# Patient Record
Sex: Female | Born: 1985 | Race: Black or African American | Hispanic: No | Marital: Single | Smoking: Current some day smoker
Health system: Southern US, Community
[De-identification: ages and names within clinical notes are randomized; demographics above are authoritative.]

## PROBLEM LIST (undated history)

## (undated) ENCOUNTER — Inpatient Hospital Stay (HOSPITAL_COMMUNITY): Payer: Self-pay

## (undated) DIAGNOSIS — Z87442 Personal history of urinary calculi: Secondary | ICD-10-CM

## (undated) DIAGNOSIS — K219 Gastro-esophageal reflux disease without esophagitis: Secondary | ICD-10-CM

## (undated) DIAGNOSIS — N200 Calculus of kidney: Secondary | ICD-10-CM

## (undated) DIAGNOSIS — E669 Obesity, unspecified: Secondary | ICD-10-CM

## (undated) HISTORY — DX: Gastro-esophageal reflux disease without esophagitis: K21.9

## (undated) HISTORY — DX: Calculus of kidney: N20.0

## (undated) HISTORY — DX: Obesity, unspecified: E66.9

---

## 2007-07-20 ENCOUNTER — Emergency Department (HOSPITAL_COMMUNITY): Admission: EM | Admit: 2007-07-20 | Discharge: 2007-07-20 | Payer: Self-pay | Admitting: Emergency Medicine

## 2007-07-23 ENCOUNTER — Emergency Department (HOSPITAL_COMMUNITY): Admission: EM | Admit: 2007-07-23 | Discharge: 2007-07-23 | Payer: Self-pay | Admitting: Emergency Medicine

## 2007-09-30 ENCOUNTER — Emergency Department (HOSPITAL_COMMUNITY): Admission: EM | Admit: 2007-09-30 | Discharge: 2007-09-30 | Payer: Self-pay | Admitting: Emergency Medicine

## 2008-03-24 ENCOUNTER — Emergency Department (HOSPITAL_COMMUNITY): Admission: EM | Admit: 2008-03-24 | Discharge: 2008-03-24 | Payer: Self-pay | Admitting: Emergency Medicine

## 2008-03-25 ENCOUNTER — Emergency Department (HOSPITAL_COMMUNITY): Admission: EM | Admit: 2008-03-25 | Discharge: 2008-03-25 | Payer: Self-pay | Admitting: Emergency Medicine

## 2008-08-04 ENCOUNTER — Emergency Department (HOSPITAL_COMMUNITY): Admission: EM | Admit: 2008-08-04 | Discharge: 2008-08-04 | Payer: Self-pay | Admitting: Emergency Medicine

## 2008-10-29 ENCOUNTER — Emergency Department (HOSPITAL_COMMUNITY): Admission: EM | Admit: 2008-10-29 | Discharge: 2008-10-29 | Payer: Self-pay | Admitting: Emergency Medicine

## 2009-05-19 ENCOUNTER — Emergency Department (HOSPITAL_COMMUNITY): Admission: EM | Admit: 2009-05-19 | Discharge: 2009-05-19 | Payer: Self-pay | Admitting: Emergency Medicine

## 2009-07-14 ENCOUNTER — Ambulatory Visit: Payer: Self-pay | Admitting: Family Medicine

## 2009-07-14 DIAGNOSIS — F17200 Nicotine dependence, unspecified, uncomplicated: Secondary | ICD-10-CM | POA: Insufficient documentation

## 2009-07-14 DIAGNOSIS — R5383 Other fatigue: Secondary | ICD-10-CM

## 2009-07-14 DIAGNOSIS — J45909 Unspecified asthma, uncomplicated: Secondary | ICD-10-CM

## 2009-07-14 DIAGNOSIS — E669 Obesity, unspecified: Secondary | ICD-10-CM

## 2009-07-14 DIAGNOSIS — G43109 Migraine with aura, not intractable, without status migrainosus: Secondary | ICD-10-CM | POA: Insufficient documentation

## 2009-07-14 DIAGNOSIS — R5381 Other malaise: Secondary | ICD-10-CM

## 2009-07-15 ENCOUNTER — Encounter: Payer: Self-pay | Admitting: Family Medicine

## 2009-07-15 LAB — CONVERTED CEMR LAB
BUN: 8 mg/dL (ref 6–23)
Basophils Absolute: 0 10*3/uL (ref 0.0–0.1)
Cholesterol: 127 mg/dL (ref 0–200)
Creatinine, Ser: 0.83 mg/dL (ref 0.40–1.20)
Eosinophils Absolute: 0.4 10*3/uL (ref 0.0–0.7)
Eosinophils Relative: 5 % (ref 0–5)
HCT: 44.4 % (ref 36.0–46.0)
Lymphocytes Relative: 49 % — ABNORMAL HIGH (ref 12–46)
Neutrophils Relative %: 34 % — ABNORMAL LOW (ref 43–77)
Platelets: 335 10*3/uL (ref 150–400)
Potassium: 4.6 meq/L (ref 3.5–5.3)
RDW: 14.7 % (ref 11.5–15.5)
Triglycerides: 94 mg/dL (ref <150)

## 2009-11-04 ENCOUNTER — Ambulatory Visit: Payer: Self-pay | Admitting: Family Medicine

## 2009-11-04 ENCOUNTER — Other Ambulatory Visit: Admission: RE | Admit: 2009-11-04 | Discharge: 2009-11-04 | Payer: Self-pay | Admitting: Family Medicine

## 2009-11-10 ENCOUNTER — Encounter: Payer: Self-pay | Admitting: Family Medicine

## 2010-03-10 ENCOUNTER — Ambulatory Visit: Payer: Self-pay | Admitting: Family Medicine

## 2010-06-30 NOTE — Letter (Signed)
Summary: Letter  Letter   Imported By: Lind Guest 11/12/2009 09:21:05  _____________________________________________________________________  External Attachment:    Type:   Image     Comment:   External Document

## 2010-06-30 NOTE — Letter (Signed)
Summary: med list review  med list review   Imported By: Lind Guest 03/10/2010 11:40:48  _____________________________________________________________________  External Attachment:    Type:   Image     Comment:   External Document

## 2010-06-30 NOTE — Assessment & Plan Note (Signed)
Summary: NEW PATIENT   Vital Signs:  Patient profile:   25 year old female Menstrual status:  irregular LMP:     06/12/2009 Height:      65 inches Weight:      282.75 pounds BMI:     47.22 Pulse rate:   77 / minute Pulse rhythm:   regular Resp:     16 per minute BP sitting:   120 / 70 Cuff size:   xl  Vitals Entered By: Everitt Amber (July 14, 2009 1:57 PM)  Nutrition Counseling: Patient's BMI is greater than 25 and therefore counseled on weight management options. CC: New Patient Is Patient Diabetic? No Pain Assessment Patient in pain? no      LMP (date): 06/12/2009     Menstrual Status irregular Enter LMP: 06/12/2009   CC:  New Patient.  History of Present Illness: New pt eval for this essntially healthy young female who is obese, and has recently adopted lifestyle changes to address this. She ahs a h/o childhood asthma, smokes currently from 2 rto 6 ciggs/day, but is willing to committing to stopping. Seh also has classic migraines approx twice per yr,a dnresponds to allevex 1. Sh uses seat belts , but no contraception on a regular basis.  Preventive Screening-Counseling & Management  Alcohol-Tobacco     Smoking Status: current     Smoking Cessation Counseling: yes  Caffeine-Diet-Exercise     Does Patient Exercise: yes      Drug Use:  no.    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 Eyes:  Denies blurring and discharge. ENT:  Denies ear discharge, earache, hoarseness, nasal congestion, and sinus pressure. CV:  Denies chest pain or discomfort, difficulty breathing while lying down, palpitations, shortness of breath with exertion, and swelling of feet. Resp:  Complains of wheezing; childhood asthma, last Ed visit last May, uses  an OTC rescueinhaler on avg once per month. GI:  Denies abdominal pain, constipation, diarrhea, nausea, and vomiting. GU:  Complains of abnormal vaginal bleeding; denies dysuria, incontinence, nocturia, urinary frequency, and urinary hesitancy; no contraception use , not currently acyive, but interested, irreg cycles bleeds every 2 to 3 mths for the past approx 4 yrs. MS:  Denies joint pain and stiffness. Derm:  Denies itching and rash. Neuro:  Complains of headaches; migraines on avg twice per year , with visual aura, for approx 18 months, alleve x 1 relieves.Marland Kitchen Psych:  Denies anxiety and depression. Endo:  Denies cold intolerance, excessive hunger, excessive thirst, excessive urination, heat intolerance, polyuria, and weight change. Heme:  Denies abnormal bruising and bleeding. Allergy:  Complains of seasonal allergies; denies hives or rash; worse in the Summer.  Physical Exam  General:  Well-developed,obeseiin no acute distress; alert,appropriate and cooperative throughout examination HEENT: No facial asymmetry,  EOMI, No sinus tenderness, TM's Clear, oropharynx  pink and moist.   Chest: Clear to auscultation bilaterally.  CVS: S1, S2, No murmurs, No S3.   Abd: Soft, Nontender.  MS: Adequate ROM spine, hips, shoulders and knees.  Ext: No edema.   CNS: CN 2-12 intact, power tone and sensation normal throughout.   Skin: Intact, no visible lesions or rashes.  Psych: Good eye contact, normal affect.  Memory intact, not anxious or depressed appearing.    Impression &  Recommendations:  Problem # 1:  MIGRAINE, CLASSICAL (ICD-346.00) Assessment Unchanged no prophylaxis needs pt will continue alleve as needed, on avgh 2 episodes per yr  Problem # 2:  ASTHMA (ICD-493.90) Assessment: Comment Only  Her updated medication list for this problem includes:    Proair Hfa 108 (90 Base) Mcg/act Aers (Albuterol sulfate) .Marland Kitchen... 2 puffs every 6 to 8 hrs as needed uses rescue inhaler approx once every 2 months, has not been hospitalised for this problem  Problem # 3:  OBESITY, UNSPECIFIED (ICD-278.00) Assessment: Comment Only  Ht: 65 (07/14/2009)   Wt: 282.75 (07/14/2009)   BMI: 47.22 (07/14/2009), lifestyle changes already started are encouraged and a[pplauded , target 1 to 2 pound weight loss per week  Problem # 4:  NICOTINE ADDICTION (ICD-305.1) Assessment: Comment Only  Encouraged smoking cessation and discussed different methods for smoking cessation.   Complete Medication List: 1)  Proair Hfa 108 (90 Base) Mcg/act Aers (Albuterol sulfate) .... 2 puffs every 6 to 8 hrs as needed 2)  Sprintec 28 0.25-35 Mg-mcg Tabs (Norgestimate-eth estradiol) .... Use as directed  Other Orders: T-Basic Metabolic Panel [id number]) T-CBC w/Diff [id number]) T-Lipid Profile [id number]) T-TSH [id number])  Patient Instructions: 1)  CPE in 6 to 8 weeks. 2)  Tobacco is very bad for your health and your loved ones! You Should stop smoking!.corrent is 6/day 3)  Stop Smoking Tips: Choose a Quit date. Cut down before the Quit date. decide what you will do as a substitute when you feel the urge to smoke(gum,toothpick,exercise). 4)  It is important that you exercise regularly at least 60 minutes 5 times a week. If you develop chest pain, have severe difficulty breathing, or feel very tired , stop exercising immediately and seek medical attention. 5)  You need to lose weight. Consider a lower calorie diet and regular exercise. Congrats on recent weight loss and lifestyle change,  keep it up. 6)  If you could be exposed to sexually transmitted diseases, you should use a condom. 7)  If you are having sex and you or your partner don't want a child, use contraception.AStart the first pill on daty one 1 , of your next cycle pls 8)  Continue regular seatbelt use Prescriptions: SPRINTEC 28 0.25-35 MG-MCG TABS (NORGESTIMATE-ETH ESTRADIOL) Use as directed  #1 x 2   Entered and Authorized by:   Syliva Overman MD   Signed by:   Syliva Overman MD on 07/14/2009   Method used:   Electronically to        [facility]  [address] (retail)       [address]       [place], [place]  [address]       Ph: [phone]       Fax: [phone]   RxID:   [id number] PROAIR HFA 108 (90 BASE) MCG/ACT AERS (ALBUTEROL SULFATE) 2 puffs every 6 to 8 hrs as needed  #1 x 4   Entered and Authorized by:   Syliva Overman MD   Signed by:   Syliva Overman MD on 07/14/2009  Method used:   Electronically to        [facility]  [address] (retail)       [address]       [place], [place]  [address]       Ph: [phone]       Fax: [phone]   RxID:   [id number]    Orders Added: 1)  New Patient Level IV [99204] 2)  T-Basic Metabolic Panel [id number] 3)  T-CBC w/Diff [[id number]] 4)  T-Lipid Profile [[id number]] 5)  T-TSH [[id number]]

## 2010-06-30 NOTE — Assessment & Plan Note (Signed)
Summary: office visit   Vital Signs:  Patient profile:   25 year old female Menstrual status:  irregular Height:      65 inches Weight:      272.25 pounds BMI:     45.47 O2 Sat:      99 % on Room air Pulse rate:   94 / minute Pulse rhythm:   regular Resp:     16 per minute BP sitting:   118 / 70  (left arm)  Vitals Entered By: Adella Hare LPN (March 10, 2010 9:19 AM)  Nutrition Counseling: Patient's BMI is greater than 25 and therefore counseled on weight management options.  O2 Flow:  Room air CC: follow-up visit Is Patient Diabetic? No Pain Assessment Patient in pain? no        CC:  follow-up visit.  History of Present Illness: Reports  that she has been doing well. She has been modifying her dioet to facilitate weight loss, and has good support on this from her mOm. Her exercise routine is still not established, she is pleased with the current loss and plans to improve on this. She stil.smokes cigarettes, less than in the past, no quit date set yet. Denies recent fever or chills. Denies sinus pressure, nasal congestion , ear pain or sore throat. Denies chest congestion, or cough productive of sputum. Denies chest pain, palpitations, PND, orthopnea or leg swelling. Denies abdominal pain, nausea, vomitting, diarrhea or constipation. Denies change in bowel movements or bloody stool. Denies dysuria , frequency, incontinence or hesitancy. Denies  joint pain, swelling, or reduced mobility. Denies headaches, vertigo, seizures. Denies depression, anxiety or insomnia. Denies  rash, lesions, or itch.     Allergies (verified): No Known Drug Allergies  Past History:  Past medical, surgical, family and social histories (including risk factors) reviewed for relevance to current acute and chronic problems.  Past Medical History: Asthma -since birth obesity  Past Surgical History: Reviewed history from 07/14/2009 and no changes required. Denies surgical  history  Family History: Reviewed history from 07/14/2009 and no changes required. Mother-Living-HTN Father-Living-Diabetes, HTN  2 sisters-healthy  Social History: Reviewed history from 07/14/2009 and no changes required. 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 Endo:  Denies cold intolerance, excessive thirst, excessive urination, and heat intolerance. Heme:  Denies abnormal bruising and bleeding. Allergy:  Denies hives or rash and itching eyes.  Physical Exam  General:  Well-developedobese,in no acute distress; alert,appropriate and cooperative throughout examination HEENT: No facial asymmetry,  EOMI, No sinus tenderness, TM's Clear, oropharynx  pink and moist.   Chest: Clear to auscultation bilaterally.  CVS: S1, S2, No murmurs, No S3.   Abd: Soft, Nontender.  MS: Adequate ROM spine, hips, shoulders and knees.  Ext: No edema.   CNS: CN 2-12 intact, power tone and sensation normal throughout.   Skin: Intact, no visible lesions or rashes.  Psych: Good eye contact, normal affect.  Memory intact, not anxious or depressed appearing.    Impression & Recommendations:  Problem # 1:  NICOTINE ADDICTION (ICD-305.1) Assessment Unchanged  Encouraged smoking cessation and discussed different methods for smoking cessation.   Problem # 2:  OBESITY, UNSPECIFIED (ICD-278.00) Assessment: Improved  Ht: 65 (03/10/2010)   Wt: 272.25 (03/10/2010)   BMI: 45.47 (  03/10/2010) therapeutic lifestyle change discussed and encouraged , 1200 cal diet given  Problem # 3:  ASTHMA (ICD-493.90) Assessment: Improved  Her updated medication list for this problem includes:    Proair Hfa 108 (90 Base) Mcg/act Aers (Albuterol sulfate) .Marland Kitchen... 2 puffs every 6 to 8 hrs as  needed  Complete Medication List: 1)  Proair Hfa 108 (90 Base) Mcg/act Aers (Albuterol sulfate) .... 2 puffs every 6 to 8 hrs as needed 2)  Sprintec 28 0.25-35 Mg-mcg Tabs (Norgestimate-eth estradiol) .... Use as directed  Other Orders: Influenza Vaccine NON MCR ([id number])  Patient Instructions: 1)  Please schedule a follow-up appointment in 4 months (end February) 2)  Congrats on your 10 pound weight loss since Feb, Pls aim for 10 pounds in the next 4.5 months. 3)  Pls start walking for 30 mins at leasdt every day. 4)  Pls cut back on calories in and eat regularly. You have made excellent progress so far. 5)  Flu vac today   Influenza Vaccine    Vaccine Type: Fluvax Non-MCR    Site: right deltoid    Mfr: novartis    Dose: 0.5 ml    Route: IM    Given by: Mauricia Area, CMA    Exp. Date: 09/2010    Lot #: [id number]    VIS given: 12/23/09 version given March 10, 2010.

## 2010-06-30 NOTE — Assessment & Plan Note (Signed)
Summary: physical   Vital Signs:  Patient profile:   25 year old female Menstrual status:  irregular Height:      65 inches Weight:      278 pounds BMI:     46.43 O2 Sat:      95 % on Room air Pulse rate:   85 / minute Pulse rhythm:   regular Resp:     16 per minute BP sitting:   104 / 66  (left arm)  Vitals Entered By: Adella Hare LPN (November 05, 7827 10:02 AM)  Nutrition Counseling: Patient's BMI is greater than 25 and therefore counseled on weight management options.  O2 Flow:  Room air CC: physical Is Patient Diabetic? No Pain Assessment Patient in pain? no       Vision Screening:Left eye w/o correction: 20 / 20 Right Eye w/o correction: 20 / 15 Both eyes w/o correction:  20/ 15  Color vision testing: normal      Vision Entered By: Adella Hare LPN (November 05, 5619 10:03 AM)   CC:  physical.  History of Present Illness: Reports  thatshe has been doing well. Denies recent fever or chills. Denies sinus pressure, nasal congestion , ear pain or sore throat. Denies chest congestion, or cough productive of sputum. Denies chest pain, palpitations, PND, orthopnea or leg swelling. Denies abdominal pain, nausea, vomitting, diarrhea or constipation. Denies change in bowel movements or bloody stool. Denies dysuria , frequency, incontinence or hesitancy. Denies  joint pain, swelling, or reduced mobility. Denies headaches, vertigo, seizures. Denies depression, anxiety or insomnia. Denies  rash, lesions, or itch.     Preventive Screening-Counseling & Management  Alcohol-Tobacco     Smoking Cessation Counseling: yes  Current Medications (verified): 1)  Proair Hfa 108 (90 Base) Mcg/act Aers (Albuterol Sulfate) .... 2 Puffs Every 6 To 8 Hrs As Needed 2)  Sprintec 28 0.25-35 Mg-Mcg Tabs (Norgestimate-Eth Estradiol) .... Use As Directed  Allergies (verified): No Known Drug Allergies  Review of Systems      See HPI General:  Complains of fatigue. Eyes:  Denies  blurring, discharge, and double vision. Endo:  Denies cold intolerance, excessive hunger, excessive thirst, excessive urination, heat intolerance, polyuria, and weight change. Heme:  Denies abnormal bruising and bleeding. Allergy:  Complains of seasonal allergies; denies hives or rash and itching eyes.  Physical Exam  General:  Well-developedobese,in no acute distress; alert,appropriate and cooperative throughout examination Head:  Normocephalic and atraumatic without obvious abnormalities. No apparent alopecia or balding. Eyes:  No corneal or conjunctival inflammation noted. EOMI. Perrla. Funduscopic exam benign, without hemorrhages, exudates or papilledema. Vision grossly normal. Ears:  External ear exam shows no significant lesions or deformities.  Otoscopic examination reveals clear canals, tympanic membranes are intact bilaterally without bulging, retraction, inflammation or discharge. Hearing is grossly normal bilaterally. Nose:  External nasal examination shows no deformity or inflammation. Nasal mucosa are pink and moist without lesions or exudates. Mouth:  Oral mucosa and oropharynx without lesions or exudates.  Teeth in good repair. Neck:  No deformities, masses, or tenderness noted. Chest Wall:  No deformities, masses, or tenderness noted. Breasts:  No mass, nodules, thickening, tenderness, bulging, retraction, inflamation, nipple discharge or skin changes noted.   Lungs:  Normal respiratory effort, chest expands symmetrically. Lungs are clear to auscultation, no crackles or wheezes. Heart:  Normal rate and regular rhythm. S1 and S2 normal without gallop, murmur, click, rub or other extra sounds. Abdomen:  Bowel sounds positive,abdomen soft and non-tender without masses,  organomegaly or hernias noted. Genitalia:  Normal introitus for age, no external lesions, no vaginal discharge, mucosa pink and moist, no vaginal or cervical lesions, no vaginal atrophy, no friaility or hemorrhage,  normal uterus size and position, no adnexal masses or tenderness Msk:  No deformity or scoliosis noted of thoracic or lumbar spine.   Pulses:  R and L carotid,radial,femoral,dorsalis pedis and posterior tibial pulses are full and equal bilaterally Extremities:  No clubbing, cyanosis, edema, or deformity noted with normal full range of motion of all joints.   Neurologic:  No cranial nerve deficits noted. Station and gait are normal. Plantar reflexes are down-going bilaterally. DTRs are symmetrical throughout. Sensory, motor and coordinative functions appear intact. Skin:  Intact without suspicious lesions or rashes Cervical Nodes:  No lymphadenopathy noted Axillary Nodes:  No palpable lymphadenopathy Inguinal Nodes:  No significant adenopathy Psych:  Cognition and judgment appear intact. Alert and cooperative with normal attention span and concentration. No apparent delusions, illusions, hallucinations   Impression & Recommendations:  Problem # 1:  PHYSICAL EXAMINATION (ICD-V70.0) Assessment Comment Only pap sent, safe sex counselling don, contraceptives prescribed, seat belt use encouraged  Problem # 2:  NICOTINE ADDICTION (ICD-305.1) Assessment: Unchanged  Encouraged smoking cessation and discussed different methods for smoking cessation.   Problem # 3:  MIGRAINE, CLASSICAL (ICD-346.00) Assessment: Improved  Problem # 4:  OBESITY, UNSPECIFIED (ICD-278.00) Assessment: Improved  Ht: 65 (11/04/2009)   Wt: 278 (11/04/2009)   BMI: 46.43 (11/04/2009)  Complete Medication List: 1)  Proair Hfa 108 (90 Base) Mcg/act Aers (Albuterol sulfate) .... 2 puffs every 6 to 8 hrs as needed 2)  Sprintec 28 0.25-35 Mg-mcg Tabs (Norgestimate-eth estradiol) .... Use as directed  Other Orders: Pap Smear ([address]109)  Patient Instructions: 1)  Please schedule a follow-up appointment in 4 months. 2)  It is important that you exercise regularly at least 20 minutes 5 times a week. If you develop chest pain,  have severe difficulty breathing, or feel very tired , stop exercising immediately and seek medical attention. 3)  You need to lose weight. Consider a lower calorie diet and regular exercise. CONGRATt ON YOUR 4 POUND WEIGHT LOSS 4)  Tobacco is very bad for your health and your loved ones! You Should stop smoking!. 5)  Stop Smoking Tips: Choose a Quit date. Cut down before the Quit date. decide what you will do as a substitute when you feel the urge to smoke(gum,toothpick,exercise).

## 2010-07-14 ENCOUNTER — Ambulatory Visit (INDEPENDENT_AMBULATORY_CARE_PROVIDER_SITE_OTHER): Payer: BC Managed Care – PPO | Admitting: Family Medicine

## 2010-07-14 ENCOUNTER — Encounter: Payer: Self-pay | Admitting: Family Medicine

## 2010-07-14 DIAGNOSIS — E669 Obesity, unspecified: Secondary | ICD-10-CM

## 2010-07-14 DIAGNOSIS — J45909 Unspecified asthma, uncomplicated: Secondary | ICD-10-CM

## 2010-07-14 DIAGNOSIS — F17200 Nicotine dependence, unspecified, uncomplicated: Secondary | ICD-10-CM

## 2010-07-14 LAB — CONVERTED CEMR LAB
BUN: 12 mg/dL (ref 6–23)
Calcium: 9.4 mg/dL (ref 8.4–10.5)
Cholesterol: 136 mg/dL (ref 0–200)
Eosinophils Absolute: 0.7 10*3/uL (ref 0.0–0.7)
Eosinophils Relative: 6 % — ABNORMAL HIGH (ref 0–5)
Glucose, Bld: 81 mg/dL (ref 70–99)
HCT: 43.4 % (ref 36.0–46.0)
Hemoglobin: 14.3 g/dL (ref 12.0–15.0)
Hgb A1c MFr Bld: 5.7 % — ABNORMAL HIGH (ref <5.7)
Lymphs Abs: 4.8 10*3/uL — ABNORMAL HIGH (ref 0.7–4.0)
MCV: 83.9 fL (ref 78.0–100.0)
Monocytes Absolute: 0.8 10*3/uL (ref 0.1–1.0)
Monocytes Relative: 7 % (ref 3–12)
Potassium: 4.2 meq/L (ref 3.5–5.3)
RBC: 5.17 M/uL — ABNORMAL HIGH (ref 3.87–5.11)
VLDL: 17 mg/dL (ref 0–40)
WBC: 10.6 10*3/uL — ABNORMAL HIGH (ref 4.0–10.5)

## 2010-07-28 NOTE — Assessment & Plan Note (Signed)
Summary: follow up   Vital Signs:  Patient profile:   25 year old female Menstrual status:  irregular Height:      65 inches Weight:      270.75 pounds BMI:     45.22 O2 Sat:      96 % Pulse rate:   83 / minute Pulse rhythm:   regular Resp:     16 per minute BP sitting:   112 / 74  (left arm) Cuff size:   xl  Vitals Entered By: Everitt Amber LPN (July 14, 2010 9:36 AM)  Nutrition Counseling: Patient's BMI is greater than 25 and therefore counseled on weight management options. CC: Follow up visit    CC:  Follow up visit .  History of Present Illness: Reports  that she has been  doing well. she has cut back on her cigarettesan intends to quit. she is changeing her diet and increasing physical activity , with slow weight loss. Denies recent fever or chills. Denies sinus pressure, nasal congestion , ear pain or sore throat. Denies chest congestion, or cough productive of sputum. Denies chest pain, palpitations, PND, orthopnea or leg swelling. Denies abdominal pain, nausea, vomitting, diarrhea or constipation. Denies change in bowel movements or bloody stool. Denies dysuria , frequency, incontinence or hesitancy. Denies  joint pain, swelling, or reduced mobility. Denies headaches, vertigo, seizures. Denies depression, anxiety or insomnia. Denies  rash, lesions, or itch.     Current Medications (verified): 1)  Proair Hfa 108 (90 Base) Mcg/act Aers (Albuterol Sulfate) .... 2 Puffs Every 6 To 8 Hrs As Needed  Allergies (verified): No Known Drug Allergies  Review of Systems      See HPI Eyes:  Denies blurring and discharge. Resp:  Denies cough, shortness of breath, and wheezing. Endo:  Denies cold intolerance, excessive hunger, excessive thirst, excessive urination, and heat intolerance. Heme:  Denies abnormal bruising and bleeding. Allergy:  Denies hives or rash and itching eyes.  Physical Exam  General:  Well-developed,obese,in no acute distress;  alert,appropriate and cooperative throughout examination HEENT: No facial asymmetry,  EOMI, No sinus tenderness, TM's Clear, oropharynx  pink and moist.   Chest: Clear to auscultation bilaterally.  CVS: S1, S2, No murmurs, No S3.   Abd: Soft, Nontender.  MS: Adequate ROM spine, hips, shoulders and knees.  Ext: No edema.   CNS: CN 2-12 intact, power tone and sensation normal throughout.   Skin: Intact, no visible lesions or rashes.  Psych: Good eye contact, normal affect.  Memory intact, not anxious or depressed appearing.    Impression & Recommendations:  Problem # 1:  NICOTINE ADDICTION (ICD-305.1) Assessment Improved  Encouraged smoking cessation and discussed different methods for smoking cessation.   Problem # 2:  OBESITY, UNSPECIFIED (ICD-278.00) Assessment: Improved  Ht: 65 (07/14/2010)   Wt: 270.75 (07/14/2010)   BMI: 45.22 (07/14/2010) therapeutic lifestyle change discussed and encouraged  Problem # 3:  ASTHMA (ICD-493.90) Assessment: Improved  Her updated medication list for this problem includes:    Proair Hfa 108 (90 Base) Mcg/act Aers (Albuterol sulfate) .Marland Kitchen... 2 puffs every 6 to 8 hrs as needed  Complete Medication List: 1)  Proair Hfa 108 (90 Base) Mcg/act Aers (Albuterol sulfate) .... 2 puffs every 6 to 8 hrs as needed  Other Orders: T-Basic Metabolic Panel [id number]) T-Lipid Profile [id number]) T-CBC w/Diff [id number]) T- Hemoglobin A1C ([id number]) T-TSH [id number])  Patient Instructions: 1)  CPE june 8 or after. 2)  pls continue to follow healthy eating habits. 3)  pls  increase exercise to a minimum of 5 days per week, min 30 mins. 4)  YOU have lost 12 pounds in the past year.Marland Kitchen 5)  Congrats on reducing nicotine, pls do 2 ciggs/day for the nex week then 1 then aim to quit in the next 4 weeks 6)  BMP prior to visit, ICD-9: 7)  Lipid Panel prior to visit, ICD-9: 8)  TSH prior to visit, ICD-9:   fasting  today 9)  CBC w/ Diff prior to visit,  ICD-9: 10)  HbgA1C prior to visit, ICD-9:   Orders Added: 1)  Est. Patient Level IV [14782] 2)  T-Basic Metabolic Panel [[id number]] 3)  T-Lipid Profile [[id number]] 4)  T-CBC w/Diff [[id number]] 5)  T- Hemoglobin A1C [[id number]] 6)  T-TSH [[id number]]

## 2010-09-07 LAB — PREGNANCY, URINE: Preg Test, Ur: NEGATIVE

## 2010-09-07 LAB — URINALYSIS, ROUTINE W REFLEX MICROSCOPIC
Bilirubin Urine: NEGATIVE
Nitrite: NEGATIVE
Specific Gravity, Urine: 1.02 (ref 1.005–1.030)
Urobilinogen, UA: 0.2 mg/dL (ref 0.0–1.0)

## 2010-09-07 LAB — URINE CULTURE

## 2010-09-10 LAB — PREGNANCY, URINE: Preg Test, Ur: NEGATIVE

## 2010-10-22 ENCOUNTER — Other Ambulatory Visit: Payer: Self-pay

## 2010-10-22 MED ORDER — ALBUTEROL SULFATE HFA 108 (90 BASE) MCG/ACT IN AERS: 2.0000 | INHALATION_SPRAY | Freq: Four times a day (QID) | RESPIRATORY_TRACT | Status: DC | PRN

## 2010-11-06 ENCOUNTER — Encounter: Payer: Self-pay | Admitting: Family Medicine

## 2010-11-10 ENCOUNTER — Ambulatory Visit (INDEPENDENT_AMBULATORY_CARE_PROVIDER_SITE_OTHER): Payer: BC Managed Care – PPO | Admitting: Family Medicine

## 2010-11-10 ENCOUNTER — Other Ambulatory Visit (HOSPITAL_COMMUNITY)
Admission: RE | Admit: 2010-11-10 | Discharge: 2010-11-10 | Disposition: A | Discharge status: Home / Self Care | Payer: BC Managed Care – PPO | Source: Ambulatory Visit | Attending: Family Medicine | Admitting: Family Medicine

## 2010-11-10 ENCOUNTER — Encounter: Payer: Self-pay | Admitting: Family Medicine

## 2010-11-10 VITALS — BP 116/70 | HR 64 | Resp 16 | Ht 65.75 in | Wt 278.0 lb

## 2010-11-10 DIAGNOSIS — F17200 Nicotine dependence, unspecified, uncomplicated: Secondary | ICD-10-CM

## 2010-11-10 DIAGNOSIS — E669 Obesity, unspecified: Secondary | ICD-10-CM

## 2010-11-10 DIAGNOSIS — N760 Acute vaginitis: Secondary | ICD-10-CM

## 2010-11-10 DIAGNOSIS — Z0000 Encounter for general adult medical examination without abnormal findings: Secondary | ICD-10-CM

## 2010-11-10 DIAGNOSIS — Z01419 Encounter for gynecological examination (general) (routine) without abnormal findings: Secondary | ICD-10-CM | POA: Insufficient documentation

## 2010-11-10 DIAGNOSIS — J45909 Unspecified asthma, uncomplicated: Secondary | ICD-10-CM

## 2010-11-10 NOTE — Patient Instructions (Addendum)
F/u in 4 months.  Weight loss goal is 3 to 6 pounds per month. Ok to call in for appetitie suppressant after 2 months if you are not losing weight   It is important that you exercise regularly at least 30 minutes 5 times a week. If you develop chest pain, have severe difficulty breathing, or feel very tired, stop exercising immediately and seek medical attention  A healthy diet is rich in fruit, vegetables and whole grains. Poultry fish, nuts and beans are a healthy choice for protein rather then red meat. A low sodium diet and drinking 64 ounces of water daily is generally recommended. Oils and sweet should be limited. Carbohydrates especially for those who are diabetic or overweight, should be limited to 34-45 gram per meal. It is important to eat on a regular schedule, at least 3 times daily. Snacks should be primarily fruits, vegetables or nuts.   HBA1C today   Please think about quitting smoking.  This is very important for your health.  Consider setting a quit date, then cutting back or switching brands to prepare to stop.  Also think of the money you will save every day by not smoking.  Quick Tips to Quit Smoking: Fix a date i.e. keep a date in mind from when you would not touch a tobacco product to smoke  Keep yourself busy and block your mind with work loads or reading books or watching movies in malls where smoking is not allowed  Vanish off the things which reminds you about smoking for example match box, or your favorite lighter, or the pipe you used for smoking, or your favorite jeans and shirt with which you used to enjoy smoking, or the club where you used to do smoking  Try to avoid certain people places and incidences where and with whom smoking is a common factor to add on  Praise yourself with some token gifts from the money you saved by stopping smoking  Anti Smoking teams are there to help you. Join their programs  Anti-smoking Gums are there in many medical shops. Try them  to quit smoking   Side-effects of Smoking: Disease caused by smoking cigarettes are emphysema, bronchitis, heart failures  Premature death  Cancer is the major side effect of smoking  Heart attacks and strokes are the quick effects of smoking causing sudden death  Some smokers lives end up with limbs amputated  Breathing problem or fast breathing is another side effect of smoking  Due to more intakes of smokes, carbon mono-oxide goes into your brain and other muscles of the body which leads to swelling of the veins and blockage to the air passage to lungs  Carbon monoxide blocks blood vessels which leads to blockage in the flow of blood to different major body organs like heart lungs and thus leads to attacks and deaths  During pregnancy smoking is very harmful and leads to premature birth of the infant, spontaneous abortions, low weight of the infant during birth  Fat depositions to narrow and blocked blood vessels causing heart attacks  In many cases cigarette smoking caused infertility in men

## 2010-11-11 LAB — GC/CHLAMYDIA PROBE AMP, GENITAL: Chlamydia, DNA Probe: NEGATIVE

## 2010-11-11 LAB — WET PREP BY MOLECULAR PROBE
Candida species: NEGATIVE
Gardnerella vaginalis: NEGATIVE
Trichomonas vaginosis: NEGATIVE

## 2010-11-11 NOTE — Assessment & Plan Note (Signed)
Improved smokes 1 to 2 per day, not willing to quit yet, counseled

## 2010-11-11 NOTE — Assessment & Plan Note (Signed)
No recent flares, stable at this time, no need for daily medication

## 2010-11-11 NOTE — Assessment & Plan Note (Signed)
Deteriorated. Patient re-educated about  the importance of commitment to a  minimum of 150 minutes of exercise per week. The importance of healthy food choices with portion control discussed. Encouraged to start a food diary, count calories and to consider  joining a support group. Sample diet sheets offered. Goals set by the patient for the next several months.    

## 2010-11-11 NOTE — Progress Notes (Signed)
  Subjective:    Patient ID: Jody Warren, female    DOB: [birth year], 25 y.o.   MRN: [id number]  HPI The PT is here for annual exam  and re-evaluation of chronic medical conditions, medication management and review o any f recent lab and radiology data.  Preventive health is updated, specifically  Cancer screening,  and Immunization.     There are no new concerns.  There are no specific complaints       Review of Systems Denies recent fever or chills. Denies sinus pressure, nasal congestion, ear pain or sore throat. Denies chest congestion, productive cough or wheezing. Denies chest pains, palpitations, paroxysmal nocturnal dyspnea, orthopnea and leg swelling Denies abdominal pain, nausea, vomiting,diarrhea or constipation.  Denies rectal bleeding or change in bowel movement. Denies dysuria, frequency, hesitancy or incontinence. Denies joint pain, swelling and limitation in mobility. Denies headaches, seizure, numbness, or tingling. Denies depression, anxiety or insomnia. Denies skin break down or rash.        Objective:   Physical Exam Pleasant  female, alert and oriented x 3, in no cardio-pulmonary distress. Afebrile. HEENT No facial trauma or asymetry.   EOMI, PERTL, fundoscopic exam is normal, no hemorhage or exudate. No papiledema External ears normal, tympanic membranes clear. Oropharynx moist, no exudate, good dentition. Neck: supple, no adenopathy,JVD or thyromegaly.No bruits.  Chest: Clear to ascultation bilaterally.No crackles or wheezes. Non tender to palpation  Breast: No asymetry,no masses. No nipple discharge or inversion. No axillary or supraclavicular adenopathy  Cardiovascular system; Heart sounds normal,  S1 and  S2 ,no S3.  No murmur, or thrill. Apical beat not displaced Peripheral pulses normal.  Abdomen: Soft, non tender, no organomegaly or masses. No bruits. Bowel sounds normal. No guarding, tenderness or rebound.   GU: External  genitalia normal. No lesions. Vaginal canal normal.yellow  Discharge.bloody, pt on cycle Uterus normal size, no adnexal masses, no cervical motion or adnexal tenderness.  Musculoskeletal exam: Full ROM of spine, hips , shoulders and knees. No deformity ,swelling or crepitus noted. No muscle wasting or atrophy.   Neurologic: Cranial nerves 2 to 12 intact. Power, tone ,sensation and reflexes normal throughout. No disturbance in gait. No tremor.  Skin: Intact, no ulceration, erythema , scaling or rash noted. Pigmentation normal throughout  Psych; Normal mood and affect. Judgement and concentration normal        Assessment & Plan:

## 2011-02-22 LAB — CBC
MCHC: 34.3
MCV: 85.8
Platelets: 344
RDW: 13.5
WBC: 13.1 — ABNORMAL HIGH

## 2011-02-22 LAB — URINALYSIS, ROUTINE W REFLEX MICROSCOPIC
Ketones, ur: NEGATIVE
Protein, ur: NEGATIVE
Urobilinogen, UA: 0.2

## 2011-02-22 LAB — URINE MICROSCOPIC-ADD ON

## 2011-02-22 LAB — DIFFERENTIAL
Basophils Absolute: 0
Basophils Relative: 0
Eosinophils Absolute: 0.8 — ABNORMAL HIGH
Neutrophils Relative %: 67

## 2011-03-11 ENCOUNTER — Encounter: Payer: Self-pay | Admitting: Family Medicine

## 2011-03-16 ENCOUNTER — Encounter: Payer: Self-pay | Admitting: Family Medicine

## 2011-03-16 ENCOUNTER — Ambulatory Visit: Payer: BC Managed Care – PPO | Admitting: Family Medicine

## 2013-11-14 ENCOUNTER — Emergency Department (HOSPITAL_COMMUNITY)
Admission: EM | Admit: 2013-11-14 | Discharge: 2013-11-15 | Disposition: A | Discharge status: Home / Self Care | Payer: Self-pay | Attending: Emergency Medicine | Admitting: Emergency Medicine

## 2013-11-14 ENCOUNTER — Encounter (HOSPITAL_COMMUNITY): Payer: Self-pay | Admitting: Emergency Medicine

## 2013-11-14 ENCOUNTER — Emergency Department (HOSPITAL_COMMUNITY): Payer: Self-pay

## 2013-11-14 ENCOUNTER — Emergency Department (HOSPITAL_COMMUNITY): Payer: BC Managed Care – PPO

## 2013-11-14 DIAGNOSIS — N200 Calculus of kidney: Secondary | ICD-10-CM

## 2013-11-14 DIAGNOSIS — Z3202 Encounter for pregnancy test, result negative: Secondary | ICD-10-CM | POA: Insufficient documentation

## 2013-11-14 DIAGNOSIS — F17200 Nicotine dependence, unspecified, uncomplicated: Secondary | ICD-10-CM | POA: Insufficient documentation

## 2013-11-14 DIAGNOSIS — J45909 Unspecified asthma, uncomplicated: Secondary | ICD-10-CM | POA: Insufficient documentation

## 2013-11-14 DIAGNOSIS — E669 Obesity, unspecified: Secondary | ICD-10-CM | POA: Insufficient documentation

## 2013-11-14 DIAGNOSIS — Z792 Long term (current) use of antibiotics: Secondary | ICD-10-CM | POA: Insufficient documentation

## 2013-11-14 DIAGNOSIS — R112 Nausea with vomiting, unspecified: Secondary | ICD-10-CM

## 2013-11-14 DIAGNOSIS — R197 Diarrhea, unspecified: Secondary | ICD-10-CM | POA: Insufficient documentation

## 2013-11-14 LAB — URINE MICROSCOPIC-ADD ON

## 2013-11-14 LAB — CBC WITH DIFFERENTIAL/PLATELET
BASOS ABS: 0 10*3/uL (ref 0.0–0.1)
BASOS PCT: 0 % (ref 0–1)
EOS PCT: 0 % (ref 0–5)
Eosinophils Absolute: 0 10*3/uL (ref 0.0–0.7)
HEMATOCRIT: 42 % (ref 36.0–46.0)
Hemoglobin: 14.3 g/dL (ref 12.0–15.0)
Lymphocytes Relative: 10 % — ABNORMAL LOW (ref 12–46)
Lymphs Abs: 1.8 10*3/uL (ref 0.7–4.0)
MCH: 28.8 pg (ref 26.0–34.0)
MCHC: 34 g/dL (ref 30.0–36.0)
MCV: 84.5 fL (ref 78.0–100.0)
MONO ABS: 1 10*3/uL (ref 0.1–1.0)
Monocytes Relative: 5 % (ref 3–12)
NEUTROS ABS: 16.3 10*3/uL — AB (ref 1.7–7.7)
Neutrophils Relative %: 85 % — ABNORMAL HIGH (ref 43–77)
PLATELETS: 337 10*3/uL (ref 150–400)
RBC: 4.97 MIL/uL (ref 3.87–5.11)
RDW: 13.9 % (ref 11.5–15.5)
WBC: 19.1 10*3/uL — ABNORMAL HIGH (ref 4.0–10.5)

## 2013-11-14 LAB — BASIC METABOLIC PANEL
BUN: 18 mg/dL (ref 6–23)
CALCIUM: 9.4 mg/dL (ref 8.4–10.5)
CO2: 21 mEq/L (ref 19–32)
CREATININE: 0.9 mg/dL (ref 0.50–1.10)
Chloride: 103 mEq/L (ref 96–112)
GFR calc non Af Amer: 87 mL/min — ABNORMAL LOW (ref >90)
Glucose, Bld: 98 mg/dL (ref 70–99)
Potassium: 4.3 mEq/L (ref 3.7–5.3)
Sodium: 140 mEq/L (ref 137–147)

## 2013-11-14 LAB — HEPATIC FUNCTION PANEL
ALK PHOS: 60 U/L (ref 39–117)
ALT: 11 U/L (ref 0–35)
AST: 19 U/L (ref 0–37)
Albumin: 4.1 g/dL (ref 3.5–5.2)
BILIRUBIN TOTAL: 0.3 mg/dL (ref 0.3–1.2)
Total Protein: 8.2 g/dL (ref 6.0–8.3)

## 2013-11-14 LAB — URINALYSIS, ROUTINE W REFLEX MICROSCOPIC
Bilirubin Urine: NEGATIVE
Glucose, UA: NEGATIVE mg/dL
Ketones, ur: NEGATIVE mg/dL
Leukocytes, UA: NEGATIVE
NITRITE: NEGATIVE
PROTEIN: NEGATIVE mg/dL
Specific Gravity, Urine: 1.025 (ref 1.005–1.030)
UROBILINOGEN UA: 0.2 mg/dL (ref 0.0–1.0)
pH: 6 (ref 5.0–8.0)

## 2013-11-14 LAB — PREGNANCY, URINE: PREG TEST UR: NEGATIVE

## 2013-11-14 LAB — LIPASE, BLOOD: Lipase: 18 U/L (ref 11–59)

## 2013-11-14 MED ORDER — SODIUM CHLORIDE 0.9 % IV BOLUS (SEPSIS)
1000.0000 mL | Freq: Once | INTRAVENOUS | Status: AC
  Administered 2013-11-14: 1000 mL via INTRAVENOUS

## 2013-11-14 MED ORDER — IOHEXOL 300 MG/ML  SOLN
100.0000 mL | Freq: Once | INTRAMUSCULAR | Status: AC | PRN
  Administered 2013-11-14: 100 mL via INTRAVENOUS

## 2013-11-14 MED ORDER — IOHEXOL 300 MG/ML  SOLN
50.0000 mL | Freq: Once | INTRAMUSCULAR | Status: AC | PRN
  Administered 2013-11-14: 50 mL via ORAL

## 2013-11-14 MED ORDER — ONDANSETRON HCL 4 MG/2ML IJ SOLN
4.0000 mg | Freq: Once | INTRAMUSCULAR | Status: AC
  Administered 2013-11-14: 4 mg via INTRAVENOUS
  Filled 2013-11-14: qty 2

## 2013-11-14 NOTE — ED Notes (Signed)
MD at the bedside  

## 2013-11-14 NOTE — ED Provider Notes (Signed)
CSN: [id number]     Arrival date & time 11/14/13  1903 History  This chart was scribed for Glynn OctaveStephen Mathayus Stanbery, MD by Milly JakobJohn Lee Graves, ED Scribe. The patient was seen in room APA01/APA01. Patient's care was started at 9:23 PM.     Chief Complaint  Patient presents with  . Abdominal Pain   The history is provided by the patient. No language interpreter was used.   HPI Comments: Jody Hugebony R Warren is a 28 y.o. female who presents to the Emergency Department complaining of gradual onset, gradually worsening intermittent lower abdominal pain. She states she is not having his pain currently. She states that she experienced minor pain last night but it was worse today around 2pm. Patient states that she felt normal this morning and ate breakfast. Patient reports that she ate no unusual foods. Patient reports that it felt like she needed to have a bowel movent, but she did not. She states she had  2 episodes of watery stool earlier today. Patient states that her pain went away after she vomited 3 times at home and 1 time here. She denies history of similar symptoms. LNMP was at the end of last month. She states she is due to have her period in 9 days. Patient denies dysuria and hematuria, chest pain, back pain, SOB, vaginal bleeding,vaginal discharge, and hematochezia. Patient denies history of kidney stones or surgeries.   Past Medical History  Diagnosis Date  . Asthma since birth   . Obesity    History reviewed. No pertinent past surgical history. Family History  Problem Relation Age of Onset  . Hypertension Mother   . Hypertension Father   . Diabetes Father    History  Substance Use Topics  . Smoking status: Current Every Day Smoker -- 0.50 packs/day    Types: Cigarettes  . Smokeless tobacco: Not on file  . Alcohol Use: No   OB History   Grav Para Term Preterm Abortions TAB SAB Ect Mult Living                 Review of Systems  A complete 10 system review of systems was obtained and all  systems are negative except as noted in the HPI and PMH.    Allergies  Peanuts  Home Medications   Prior to Admission medications   Medication Sig Start Date End Date Taking? Authorizing Provider  ciprofloxacin (CIPRO) 500 MG tablet Take 1 tablet (500 mg total) by mouth every 12 (twelve) hours. 11/15/13   Glynn OctaveStephen Taryne Kiger, MD  ondansetron (ZOFRAN) 4 MG tablet Take 1 tablet (4 mg total) by mouth every 6 (six) hours. 11/15/13   Glynn OctaveStephen Larenz Frasier, MD   Triage Vitals: BP 124/72  Pulse 92  Temp(Src) 97.9 F (36.6 C) (Oral)  Resp 24  Ht 5\' 5"  (1.651 m)  Wt 246 lb 3.2 oz (111.676 kg)  BMI 40.97 kg/m2  SpO2 100%  LMP 10/22/2013 Physical Exam  Nursing note and vitals reviewed. Constitutional: She is oriented to person, place, and time. She appears well-developed and well-nourished. No distress.  HENT:  Head: Normocephalic and atraumatic.  Mouth/Throat: Oropharynx is clear and moist and mucous membranes are normal. No oropharyngeal exudate.  Eyes: Conjunctivae and EOM are normal. Pupils are equal, round, and reactive to light.  Neck: Normal range of motion. Neck supple.  No meningismus.  Cardiovascular: Normal rate, regular rhythm, normal heart sounds and intact distal pulses.   No murmur heard. Pulmonary/Chest: Effort normal and breath sounds normal. No respiratory distress.  Abdominal: Soft. There is no tenderness. There is no rebound and no guarding.  No RLQ pain.   Genitourinary:  No CVA tenderness.   Musculoskeletal: Normal range of motion. She exhibits no edema and no tenderness.  Neurological: She is alert and oriented to person, place, and time. No cranial nerve deficit. She exhibits normal muscle tone. Coordination normal.  No ataxia on finger to nose bilaterally. No pronator drift. 5/5 strength throughout. CN 2-12 intact. Negative Romberg. Equal grip strength. Sensation intact. Gait is normal.   Skin: Skin is warm.  Psychiatric: She has a normal mood and affect. Her behavior is  normal.    ED Course  Procedures (including critical care time) DIAGNOSTIC STUDIES: Oxygen Saturation is 100% on room air, normal by my interpretation.    COORDINATION OF CARE: 9:27 PM-Discussed treatment plan which includes abdominal x-ray and blood test with pt at bedside and pt agreed to plan.   Labs Review Labs Reviewed  URINALYSIS, ROUTINE W REFLEX MICROSCOPIC - Abnormal; Notable for the following:    Hgb urine dipstick LARGE (*)    All other components within normal limits  CBC WITH DIFFERENTIAL - Abnormal; Notable for the following:    WBC 19.1 (*)    Neutrophils Relative % 85 (*)    Neutro Abs 16.3 (*)    Lymphocytes Relative 10 (*)    All other components within normal limits  BASIC METABOLIC PANEL - Abnormal; Notable for the following:    GFR calc non Af Amer 87 (*)    All other components within normal limits  URINE MICROSCOPIC-ADD ON - Abnormal; Notable for the following:    Squamous Epithelial / LPF MANY (*)    Bacteria, UA MANY (*)    All other components within normal limits  URINE CULTURE  PREGNANCY, URINE  HEPATIC FUNCTION PANEL  LIPASE, BLOOD    Imaging Review Ct Abdomen Pelvis W Contrast  11/15/2013   CLINICAL DATA:  Abdominal pain.  Vomiting.  EXAM: CT ABDOMEN AND PELVIS WITH CONTRAST  TECHNIQUE: Multidetector CT imaging of the abdomen and pelvis was performed using the standard protocol following bolus administration of intravenous contrast.  CONTRAST:  50mL OMNIPAQUE IOHEXOL 300 MG/ML SOLN, 100mL OMNIPAQUE IOHEXOL 300 MG/ML SOLN  COMPARISON:  11/14/2013 acute abdominal series.  FINDINGS: Bones:  Normal.  Lung Bases: Normal.  Liver:  Normal.  Spleen:  Normal.  Gallbladder:  Contracted.  No calcified stones.  Common bile duct:  Normal.  Pancreas:  Normal.  Adrenal glands:  Normal bilaterally.  Kidneys: Normal enhancement. Mild ectasia of the left ureter is present. Minimal perinephric stranding on the left. The right ureter appears normal. No renal collecting  system calculi.  Stomach:  Distended with oral contrast.  Small bowel:  Normal.  Colon: Normal appendix. Contrast opacifies the colon which appears within normal limits.  Pelvic Genitourinary: Physiologic appearance of the uterus and adnexa. There is a punctate calculus in left side of the urinary bladder, compatible with a recently passed left renal stone.  Vasculature: Normal.  Body Wall: Normal.  IMPRESSION: Punctate calculus in the left side of the urinary bladder with Mild ectasia of the left ureter suggesting recently slip passed stone. No residual collecting system calculi.   Electronically Signed   By: Andreas NewportGeoffrey  Lamke M.D.   On: 11/15/2013 00:25   Dg Abd Acute W/chest  11/14/2013   CLINICAL DATA:  Lower to mid right-sided abdominal pain and diarrhea.  EXAM: ACUTE ABDOMEN SERIES (ABDOMEN 2 VIEW & CHEST 1 VIEW)  COMPARISON:  Chest radiograph performed 08/04/2008  FINDINGS: The lungs are well-aerated. Pulmonary vascularity is at the upper limits of normal. There is no evidence of focal opacification, pleural effusion or pneumothorax. The cardiomediastinal silhouette is within normal limits.  The visualized bowel gas pattern is unremarkable. Scattered stool and air are seen within the colon; there is no evidence of small bowel dilatation to suggest obstruction. No free intra-abdominal air is identified on the provided upright view.  No acute osseous abnormalities are seen; the sacroiliac joints are unremarkable in appearance.  IMPRESSION: 1. Unremarkable bowel gas pattern; no free intra-abdominal air seen. 2. No acute cardiopulmonary process identified.   Electronically Signed   By: Roanna Raider M.D.   On: 11/14/2013 22:36     EKG Interpretation None      MDM   Final diagnoses:  Nausea vomiting and diarrhea  Kidney stone   Lower abdominal pain with nausea, vomiting and diarrhea as last night. No fever. No urinary or vaginal symptoms. No sick contacts.  Patient nontoxic and well-hydrated.  Abdomen is soft and nontender.  UA shows hematuria without obvious infection though many squamous cells and bacteria in sample. We'll sent culture. Leukocytosis of 19.  CT scan obtained to evaluate the appendix. Appendix is normal. There is a likely a recently passed stone in the bladder.  Pain has resolved in the ED no vomiting or diarrhea. Suspect viral gastroenteritis with incidental finding of recently passed kidney stone. We'll treat supportively with nausea medications. Urine culture will be sent for possible UTI. Return precautions discussed.   BP 120/64  Pulse 64  Temp(Src) 97.9 F (36.6 C) (Oral)  Resp 20  Ht 5\' 5"  (1.651 m)  Wt 246 lb 3.2 oz (111.676 kg)  BMI 40.97 kg/m2  SpO2 99%  LMP 10/22/2013    I personally performed the services described in this documentation, which was scribed in my presence. The recorded information has been reviewed and is accurate.    Glynn Octave, MD 11/15/13 [phone]

## 2013-11-14 NOTE — ED Notes (Signed)
Patient complaining of diffuse abdominal pain and emesis since last night. Also reports some diarrhea.

## 2013-11-15 MED ORDER — ONDANSETRON HCL 4 MG PO TABS: 4.0000 mg | ORAL_TABLET | Freq: Four times a day (QID) | ORAL | Status: DC

## 2013-11-15 MED ORDER — CIPROFLOXACIN HCL 500 MG PO TABS: 500.0000 mg | ORAL_TABLET | Freq: Two times a day (BID) | ORAL | Status: DC

## 2013-11-15 NOTE — ED Notes (Signed)
Pt drinking ginger ale  

## 2013-11-15 NOTE — Discharge Instructions (Signed)

## 2013-11-15 NOTE — ED Notes (Signed)
Patient given discharge instruction, verbalized understand. IV removed, band aid applied. Patient ambulatory out of the department.  

## 2013-11-16 LAB — URINE CULTURE: Colony Count: 30000

## 2014-04-09 ENCOUNTER — Other Ambulatory Visit: Payer: Self-pay | Admitting: Obstetrics and Gynecology

## 2014-04-09 DIAGNOSIS — O3680X0 Pregnancy with inconclusive fetal viability, not applicable or unspecified: Secondary | ICD-10-CM

## 2014-04-10 ENCOUNTER — Ambulatory Visit (INDEPENDENT_AMBULATORY_CARE_PROVIDER_SITE_OTHER): Payer: Medicaid Other

## 2014-04-10 ENCOUNTER — Other Ambulatory Visit: Payer: Self-pay | Admitting: Obstetrics and Gynecology

## 2014-04-10 DIAGNOSIS — O26841 Uterine size-date discrepancy, first trimester: Secondary | ICD-10-CM

## 2014-04-10 DIAGNOSIS — O3680X0 Pregnancy with inconclusive fetal viability, not applicable or unspecified: Secondary | ICD-10-CM

## 2014-04-10 NOTE — Progress Notes (Signed)
U/S-single IUP with +FCA noted, FHR-119 bpm, CRL c/w 6+2wks EDD 12/02/2014, cx appears closed, bilateral adnexa appears WNL

## 2014-04-18 ENCOUNTER — Other Ambulatory Visit (HOSPITAL_COMMUNITY)
Admission: RE | Admit: 2014-04-18 | Discharge: 2014-04-18 | Disposition: A | Discharge status: Home / Self Care | Payer: Medicaid Other | Source: Ambulatory Visit | Attending: Advanced Practice Midwife | Admitting: Advanced Practice Midwife

## 2014-04-18 ENCOUNTER — Ambulatory Visit (INDEPENDENT_AMBULATORY_CARE_PROVIDER_SITE_OTHER): Payer: Medicaid Other | Admitting: Advanced Practice Midwife

## 2014-04-18 ENCOUNTER — Encounter: Payer: Self-pay | Admitting: Advanced Practice Midwife

## 2014-04-18 VITALS — BP 116/68 | Wt 255.0 lb

## 2014-04-18 DIAGNOSIS — Z3682 Encounter for antenatal screening for nuchal translucency: Secondary | ICD-10-CM

## 2014-04-18 DIAGNOSIS — Z130 Encounter for screening for diseases of the blood and blood-forming organs and certain disorders involving the immune mechanism: Secondary | ICD-10-CM

## 2014-04-18 DIAGNOSIS — Z1371 Encounter for nonprocreative screening for genetic disease carrier status: Secondary | ICD-10-CM

## 2014-04-18 DIAGNOSIS — Z3491 Encounter for supervision of normal pregnancy, unspecified, first trimester: Secondary | ICD-10-CM

## 2014-04-18 DIAGNOSIS — Z1389 Encounter for screening for other disorder: Secondary | ICD-10-CM

## 2014-04-18 DIAGNOSIS — Z113 Encounter for screening for infections with a predominantly sexual mode of transmission: Secondary | ICD-10-CM | POA: Diagnosis present

## 2014-04-18 DIAGNOSIS — Z1159 Encounter for screening for other viral diseases: Secondary | ICD-10-CM

## 2014-04-18 DIAGNOSIS — Z114 Encounter for screening for human immunodeficiency virus [HIV]: Secondary | ICD-10-CM

## 2014-04-18 DIAGNOSIS — J45909 Unspecified asthma, uncomplicated: Secondary | ICD-10-CM

## 2014-04-18 DIAGNOSIS — Z331 Pregnant state, incidental: Secondary | ICD-10-CM

## 2014-04-18 DIAGNOSIS — Z3401 Encounter for supervision of normal first pregnancy, first trimester: Secondary | ICD-10-CM

## 2014-04-18 DIAGNOSIS — Z3400 Encounter for supervision of normal first pregnancy, unspecified trimester: Secondary | ICD-10-CM | POA: Insufficient documentation

## 2014-04-18 DIAGNOSIS — Z0283 Encounter for blood-alcohol and blood-drug test: Secondary | ICD-10-CM

## 2014-04-18 DIAGNOSIS — N200 Calculus of kidney: Secondary | ICD-10-CM | POA: Insufficient documentation

## 2014-04-18 DIAGNOSIS — Z0184 Encounter for antibody response examination: Secondary | ICD-10-CM

## 2014-04-18 DIAGNOSIS — Z01419 Encounter for gynecological examination (general) (routine) without abnormal findings: Secondary | ICD-10-CM | POA: Diagnosis present

## 2014-04-18 DIAGNOSIS — Z118 Encounter for screening for other infectious and parasitic diseases: Secondary | ICD-10-CM

## 2014-04-18 LAB — CBC
HCT: 38.6 % (ref 36.0–46.0)
HEMOGLOBIN: 12.9 g/dL (ref 12.0–15.0)
MCH: 28.3 pg (ref 26.0–34.0)
MCHC: 33.4 g/dL (ref 30.0–36.0)
MCV: 84.6 fL (ref 78.0–100.0)
MPV: 9.4 fL (ref 9.4–12.4)
Platelets: 393 10*3/uL (ref 150–400)
RBC: 4.56 MIL/uL (ref 3.87–5.11)
RDW: 13.6 % (ref 11.5–15.5)
WBC: 11.5 10*3/uL — ABNORMAL HIGH (ref 4.0–10.5)

## 2014-04-18 LAB — POCT URINALYSIS DIPSTICK
Blood, UA: NEGATIVE
GLUCOSE UA: NEGATIVE
KETONES UA: NEGATIVE
Nitrite, UA: NEGATIVE
Protein, UA: NEGATIVE

## 2014-04-18 NOTE — Progress Notes (Signed)
Pt given CCNC form and lab consents to read over and sign. Pt denies any problems or concerns at this time.

## 2014-04-18 NOTE — Progress Notes (Signed)
  Subjective:    Jody Warren is a G1P0 261w3d being seen today for her first obstetrical visit.  Her obstetrical history is significant for nothing.  Pregnancy history fully reviewed.  Patient reports no complaints. She quit smoking before she found out she was pregnant and has no intention of restarting. Had asthma as a child and teen, no problems in >10 years.  Filed Vitals:   04/18/14 0858  BP: 116/68  Weight: 255 lb (115.667 kg)    HISTORY: OB History  Gravida Para Term Preterm AB SAB TAB Ectopic Multiple Living  1             # Outcome Date GA Lbr Len/2nd Weight Sex Delivery Anes PTL Lv  1 Current              Past Medical History  Diagnosis Date  . Asthma since birth   . Obesity   . Kidney stone    History reviewed. No pertinent past surgical history. Family History  Problem Relation Age of Onset  . Hyperlipidemia Mother   . Hypertension Father   . Diabetes Father   . Cancer Maternal Grandfather      Exam       Pelvic Exam:    Perineum: Normal Perineum   Vulva: normal   Vagina:  normal mucosa, normal discharge, no palpable nodules   Uterus Normal, Gravid, FH: ~ 7     Cervix: normal   Adnexa: Not palpable   Urinary:  urethral meatus normal    System: Breast:  normal appearance, no masses or tenderness   Skin: normal coloration and turgor, no rashes    Neurologic: oriented, normal, normal mood   Extremities: normal strength, tone, and muscle mass   HEENT PERRLA   Mouth/Teeth mucous membranes moist, normal dentition   Neck supple and no masses   Cardiovascular: regular rate and rhythm   Respiratory:  appears well, vitals normal, no respiratory distress, acyanotic   Abdomen: soft, non-tender;  FHR: 160 us          Assessment:    Pregnancy: G1P0 Patient Active Problem List   Diagnosis Date Noted  . Supervision of normal first pregnancy 04/18/2014    Priority: Low  . Kidney stone   . OBESITY, UNSPECIFIED 07/14/2009  . MIGRAINE, CLASSICAL  07/14/2009  . Asthma, currently dormant 07/14/2009  . FATIGUE 07/14/2009        Plan:     Initial labs drawn. Continue prenatal vitamins  Problem list reviewed and updated  Reviewed n/v relief measures and warning s/s to report  Reviewed recommended weight gain based on pre-gravid BMI  Encouraged well-balanced diet Genetic Screening discussed Integrated Screen: requested.  Ultrasound discussed; fetal survey: requested.  Follow up in 4 weeks for NT/IT/Low-risk ob appt  .  CRESENZO-DISHMAN,Elex Mainwaring 04/18/2014

## 2014-04-19 LAB — OXYCODONE SCREEN, UA, RFLX CONFIRM: Oxycodone Screen, Ur: NEGATIVE ng/mL

## 2014-04-19 LAB — DRUG SCREEN, URINE, NO CONFIRMATION
Amphetamine Screen, Ur: NEGATIVE
Barbiturate Quant, Ur: NEGATIVE
Benzodiazepines.: NEGATIVE
Cocaine Metabolites: NEGATIVE
Creatinine,U: 263.1 mg/dL
MARIJUANA METABOLITE: NEGATIVE
Methadone: NEGATIVE
Opiate Screen, Urine: NEGATIVE
PHENCYCLIDINE (PCP): NEGATIVE
PROPOXYPHENE: NEGATIVE

## 2014-04-19 LAB — HIV ANTIBODY (ROUTINE TESTING W REFLEX): HIV 1&2 Ab, 4th Generation: NONREACTIVE

## 2014-04-19 LAB — VARICELLA ZOSTER ANTIBODY, IGG: Varicella IgG: 1340 Index — ABNORMAL HIGH (ref <135.00)

## 2014-04-19 LAB — HEPATITIS B SURFACE ANTIGEN: Hepatitis B Surface Ag: NEGATIVE

## 2014-04-19 LAB — SICKLE CELL SCREEN: SICKLE CELL SCREEN: NEGATIVE

## 2014-04-19 LAB — URINALYSIS
Bilirubin Urine: NEGATIVE
Glucose, UA: NEGATIVE mg/dL
Hgb urine dipstick: NEGATIVE
KETONES UR: NEGATIVE mg/dL
Leukocytes, UA: NEGATIVE
NITRITE: NEGATIVE
Protein, ur: NEGATIVE mg/dL
Specific Gravity, Urine: 1.02 (ref 1.005–1.030)
Urobilinogen, UA: 0.2 mg/dL (ref 0.0–1.0)
pH: 7 (ref 5.0–8.0)

## 2014-04-19 LAB — CYTOLOGY - PAP

## 2014-04-19 LAB — RPR

## 2014-04-19 LAB — ABO AND RH: Rh Type: POSITIVE

## 2014-04-19 LAB — CYSTIC FIBROSIS DIAGNOSTIC STUDY

## 2014-04-19 LAB — RUBELLA SCREEN: Rubella: 3.33 Index — ABNORMAL HIGH (ref <0.90)

## 2014-04-19 LAB — ANTIBODY SCREEN: ANTIBODY SCREEN: NEGATIVE

## 2014-04-20 LAB — URINE CULTURE: Colony Count: >=100000

## 2014-05-27 ENCOUNTER — Ambulatory Visit (INDEPENDENT_AMBULATORY_CARE_PROVIDER_SITE_OTHER): Payer: Medicaid Other | Admitting: Women's Health

## 2014-05-27 ENCOUNTER — Ambulatory Visit (INDEPENDENT_AMBULATORY_CARE_PROVIDER_SITE_OTHER): Payer: Medicaid Other

## 2014-05-27 ENCOUNTER — Encounter: Payer: Self-pay | Admitting: Women's Health

## 2014-05-27 VITALS — BP 116/60 | Wt 261.0 lb

## 2014-05-27 DIAGNOSIS — Z331 Pregnant state, incidental: Secondary | ICD-10-CM

## 2014-05-27 DIAGNOSIS — Z3682 Encounter for antenatal screening for nuchal translucency: Secondary | ICD-10-CM

## 2014-05-27 DIAGNOSIS — Z23 Encounter for immunization: Secondary | ICD-10-CM

## 2014-05-27 DIAGNOSIS — Z36 Encounter for antenatal screening of mother: Secondary | ICD-10-CM

## 2014-05-27 DIAGNOSIS — Z3401 Encounter for supervision of normal first pregnancy, first trimester: Secondary | ICD-10-CM

## 2014-05-27 DIAGNOSIS — Z1389 Encounter for screening for other disorder: Secondary | ICD-10-CM

## 2014-05-27 DIAGNOSIS — N611 Abscess of the breast and nipple: Secondary | ICD-10-CM

## 2014-05-27 LAB — POCT URINALYSIS DIPSTICK
Glucose, UA: NEGATIVE
Ketones, UA: NEGATIVE
Leukocytes, UA: NEGATIVE
Nitrite, UA: NEGATIVE
Protein, UA: NEGATIVE
RBC UA: NEGATIVE

## 2014-05-27 MED ORDER — CLINDAMYCIN HCL 300 MG PO CAPS: 300.0000 mg | ORAL_CAPSULE | Freq: Three times a day (TID) | ORAL | Status: DC

## 2014-05-27 MED ORDER — INFLUENZA VAC SPLIT QUAD 0.5 ML IM SUSY
0.5000 mL | PREFILLED_SYRINGE | Freq: Once | INTRAMUSCULAR | Status: AC
  Administered 2014-05-27: 0.5 mL via INTRAMUSCULAR

## 2014-05-27 NOTE — Addendum Note (Signed)
Addended by: Criss AlvinePULLIAM, CHRYSTAL G on: 05/27/2014 10:24 AM   Modules accepted: Orders

## 2014-05-27 NOTE — Progress Notes (Signed)
U/S(13+0wks)-active fetus, meas c/w dates, fluid wnl, anterior Gr 0 placenta, cx appears closed, bilateral adnexa appears WNL, FHR- 152 bpm, NB present, NT-1.4488mm

## 2014-05-27 NOTE — Progress Notes (Signed)
Low-risk OB appointment G1P0 540w0d Estimated Date of Delivery: 12/02/14 BP 116/60 mmHg  Wt 261 lb (118.389 kg)  LMP 02/19/2014 (Approximate)  BP, weight, and urine reviewed.  Refer to obstetrical flow sheet for FH & FHR.  No fm yet. Denies cramping, lof, vb, or uti s/s. Bump under Rt breast x 4days, no fever/chills/heat/pain.  ~2cm boil under Rt breast, slightly tender to touch, no head. Rx clindamycin 300mg  TID x 7d, f/u in 3d Reviewed today's normal NT u/s, warning s/s to report. Plan:  Continue routine obstetrical care  F/U in 3d for f/u boil, then 4wks for OB appointment and 2nd IT 1st IT/NT and flu shot today

## 2014-05-30 ENCOUNTER — Ambulatory Visit (INDEPENDENT_AMBULATORY_CARE_PROVIDER_SITE_OTHER): Payer: Medicaid Other | Admitting: Obstetrics and Gynecology

## 2014-05-30 ENCOUNTER — Encounter: Payer: Self-pay | Admitting: Obstetrics and Gynecology

## 2014-05-30 DIAGNOSIS — Z3482 Encounter for supervision of other normal pregnancy, second trimester: Secondary | ICD-10-CM

## 2014-05-30 DIAGNOSIS — Z331 Pregnant state, incidental: Secondary | ICD-10-CM

## 2014-05-30 DIAGNOSIS — Z1389 Encounter for screening for other disorder: Secondary | ICD-10-CM

## 2014-05-30 LAB — POCT URINALYSIS DIPSTICK
Blood, UA: NEGATIVE
GLUCOSE UA: NEGATIVE
Ketones, UA: NEGATIVE
LEUKOCYTES UA: NEGATIVE
Nitrite, UA: NEGATIVE
Protein, UA: NEGATIVE

## 2014-05-30 NOTE — Progress Notes (Signed)
Pt was given medication for boil under breast but was unable to take the capsule. Pt wanted to know if she could open the capsule and put the medication in applesauce.

## 2014-05-30 NOTE — Progress Notes (Signed)
G1P0 3542w3d Estimated Date of Delivery: 12/02/14  Blood pressure 120/76, weight 257 lb (116.574 kg), last menstrual period 02/19/2014.   refer to the ob flow sheet for FH and FHR, also BP, Wt, Urine results:notable for normal results  Patient reports  Too early for good fetal movement, denies any bleeding and no rupture of membranes symptoms or regular contractions. Patient complaints:needs to open capsules, cannot take pills, on abx for skin boil under right breast.  Questions were answered. Assessment:  Plan:  Continued routine obstetrical care, u/s in Jan (25)  F/u in 4 weeks for pnx/u/s

## 2014-06-03 LAB — MATERNAL SCREEN, INTEGRATED #1

## 2014-06-24 ENCOUNTER — Encounter: Payer: Self-pay | Admitting: Obstetrics and Gynecology

## 2014-06-24 ENCOUNTER — Ambulatory Visit (INDEPENDENT_AMBULATORY_CARE_PROVIDER_SITE_OTHER): Payer: Medicaid Other | Admitting: Obstetrics and Gynecology

## 2014-06-24 VITALS — BP 120/76 | Wt 253.0 lb

## 2014-06-24 DIAGNOSIS — Z1389 Encounter for screening for other disorder: Secondary | ICD-10-CM

## 2014-06-24 DIAGNOSIS — Z3402 Encounter for supervision of normal first pregnancy, second trimester: Secondary | ICD-10-CM

## 2014-06-24 DIAGNOSIS — Z331 Pregnant state, incidental: Secondary | ICD-10-CM

## 2014-06-24 DIAGNOSIS — Z369 Encounter for antenatal screening, unspecified: Secondary | ICD-10-CM

## 2014-06-24 LAB — POCT URINALYSIS DIPSTICK
Blood, UA: NEGATIVE
GLUCOSE UA: NEGATIVE
Ketones, UA: NEGATIVE
Leukocytes, UA: NEGATIVE
NITRITE UA: NEGATIVE
PROTEIN UA: NEGATIVE

## 2014-06-24 NOTE — Progress Notes (Signed)
Pt denies any problems or concerns at this time.  

## 2014-06-24 NOTE — Progress Notes (Signed)
Patient ID: Jody Warren, female   DOB: 09/19/1985, 29 y.o.   MRN: [id number] G1P0 6348w0d Estimated Date of Delivery: 12/02/14  Blood pressure 120/76, weight 253 lb (114.76 kg), last menstrual period 02/19/2014.   refer to the ob flow sheet for FH and FHR, also BP, Wt, Urine results: negative  Patient reports +good fetal movement, denies any bleeding and no rupture of membranes symptoms or regular contractions. Patient complaints: She is complaining of constant GERD.  Drinking milk alleviates the heartburn temporarily.  She denies any vaginal discharge or abdominal cramping at this time.  FH - 17.5cm FHR - 120  Questions were answered. Assessment: 1448w0d, G1P0  Plan:  Continued routine obstetrical care F/u in 3 weeks  U/s anatomy scan.  This chart was scribed for Jody Warren V, MD by Jody Warren, ED Scribe. This patient was seen in Room 2 and the patient's care was started at 10:57 AM.

## 2014-06-27 LAB — MATERNAL SCREEN, INTEGRATED #2
AFP MOM MAT SCREEN: 0.92
AFP, SERUM MAT SCREEN: 28 ng/mL
CALCULATED GESTATIONAL AGE MAT SCREEN: 16.9
Crown Rump Length: 66.5 mm
Estriol Mom: 1.23
Estriol, Free: 1.04 ng/mL
INHIBIN A DIMERIC MAT SCREEN: 92 pg/mL
Inhibin A MoM: 0.69
MSS Down Syndrome: <1:5000 {titer}
MSS Trisomy 18 Risk: <1:5000 {titer}
NT MoM: 1.26
Nuchal Translucency: 1.88 mm
Number of fetuses: 1
PAPP-A MoM: 1.2
PAPP-A: 765 ng/mL
hCG MoM: 0.91
hCG, Serum: 24.9 IU/mL

## 2014-07-04 ENCOUNTER — Telehealth: Payer: Self-pay | Admitting: Obstetrics & Gynecology

## 2014-07-04 NOTE — Telephone Encounter (Signed)
Pt states having a lower pelvic pain and back pain. Pt states started when she strained to have a BM, no vaginal bleeding. Pt informed to take tylenol for discomfort, push fluids, rest. If no improvement call our office back. Pt verbalized understanding.

## 2014-07-12 ENCOUNTER — Other Ambulatory Visit: Payer: Self-pay | Admitting: Obstetrics and Gynecology

## 2014-07-12 DIAGNOSIS — Z3689 Encounter for other specified antenatal screening: Secondary | ICD-10-CM

## 2014-07-15 ENCOUNTER — Encounter: Payer: Medicaid Other | Admitting: Obstetrics & Gynecology

## 2014-07-15 ENCOUNTER — Other Ambulatory Visit: Payer: Medicaid Other

## 2014-07-23 ENCOUNTER — Ambulatory Visit (INDEPENDENT_AMBULATORY_CARE_PROVIDER_SITE_OTHER): Payer: Medicaid Other

## 2014-07-23 ENCOUNTER — Other Ambulatory Visit: Payer: Self-pay | Admitting: Obstetrics & Gynecology

## 2014-07-23 ENCOUNTER — Encounter: Payer: Self-pay | Admitting: Obstetrics & Gynecology

## 2014-07-23 ENCOUNTER — Ambulatory Visit (INDEPENDENT_AMBULATORY_CARE_PROVIDER_SITE_OTHER): Payer: Medicaid Other | Admitting: Obstetrics & Gynecology

## 2014-07-23 VITALS — BP 118/76 | HR 80 | Wt 259.0 lb

## 2014-07-23 DIAGNOSIS — N949 Unspecified condition associated with female genital organs and menstrual cycle: Secondary | ICD-10-CM

## 2014-07-23 DIAGNOSIS — O26872 Cervical shortening, second trimester: Secondary | ICD-10-CM

## 2014-07-23 DIAGNOSIS — O26892 Other specified pregnancy related conditions, second trimester: Principal | ICD-10-CM

## 2014-07-23 DIAGNOSIS — O0992 Supervision of high risk pregnancy, unspecified, second trimester: Secondary | ICD-10-CM

## 2014-07-23 DIAGNOSIS — Z3689 Encounter for other specified antenatal screening: Secondary | ICD-10-CM

## 2014-07-23 DIAGNOSIS — R102 Pelvic and perineal pain: Secondary | ICD-10-CM

## 2014-07-23 DIAGNOSIS — Z331 Pregnant state, incidental: Secondary | ICD-10-CM

## 2014-07-23 DIAGNOSIS — Z36 Encounter for antenatal screening of mother: Secondary | ICD-10-CM

## 2014-07-23 DIAGNOSIS — Z1389 Encounter for screening for other disorder: Secondary | ICD-10-CM

## 2014-07-23 LAB — US OB DETAIL + 14 WK

## 2014-07-23 LAB — POCT URINALYSIS DIPSTICK
Glucose, UA: NEGATIVE
KETONES UA: NEGATIVE
Leukocytes, UA: NEGATIVE
NITRITE UA: NEGATIVE
Protein, UA: NEGATIVE
RBC UA: NEGATIVE

## 2014-07-23 MED ORDER — PROGESTERONE 8 % VA GEL: 90.0000 mg | Freq: Every day | VAGINAL | Status: DC

## 2014-07-23 NOTE — Progress Notes (Signed)
Pt denies any problems or concerns at this time.  

## 2014-07-23 NOTE — Progress Notes (Signed)
U/S(21+1wks)-active fetus, vtx, FHR- 148 bpm, anterior Gr 0 placenta, bilateral adnexa appears WNL, female fetus, no obvious abnl noted, although sub-optimal views, CX-(measured vaginally)-1.5cm closed with funnel noted= 1.4cm in length and 1.7cm in width

## 2014-07-23 NOTE — Progress Notes (Signed)
Fetal Surveillance Testing today:  Sonogram is reviewed including images, short cervix   High Risk Pregnancy Diagnosis(es):   Short cervix  G1P0 2938w1d Estimated Date of Delivery: 12/02/14  Blood pressure 118/76, pulse 80, weight 259 lb (117.482 kg), last menstrual period 02/19/2014.  Urinalysis: Negative   HPI: The patient is being seen today for inititating management of short cervix after her sonogram today Today she reports no complaints at all   BP weight and urine results all reviewed and noted. Patient reports good fetal movement, denies any bleeding and no rupture of membranes symptoms or regular contractions.  Fundal Height:  22 Fetal Heart rate:  148 Edema:  none  Patient is without complaints other than noted in her HPI. All questions were answered.  All lab and sonogram results have been reviewed. Comments: sonogram is reviewed, including the images and report is done  Assessment:  1.  Pregnancy at 7138w1d,  Estimated Date of Delivery: 12/02/14                         2.  Short Cervix, assymptomatic                          Medication(s) Plans:  Prometrium 200 mg per vagina qhs  Treatment Plan:  Prometrium, repeat sonogram q2 weeks through the next 8 weeks or so  Follow up in 2 weeks for appointment for high risk OB care, sonogram

## 2014-07-25 ENCOUNTER — Telehealth: Payer: Self-pay | Admitting: Obstetrics & Gynecology

## 2014-07-25 ENCOUNTER — Telehealth: Payer: Self-pay | Admitting: *Deleted

## 2014-07-25 NOTE — Telephone Encounter (Signed)
Discussed in person with Chrystal

## 2014-07-25 NOTE — Telephone Encounter (Signed)
Pt informed per Dr. Despina HiddenEure symptoms are Normal, call our office back for pelvic or vaginal pressure or pain, gush of fluids, vaginal bleeding. Pt also informed will call for PA for Crinon gel. Pt verbalized understanding.

## 2014-07-25 NOTE — Telephone Encounter (Signed)
Prior Authorization approved for Crinone 8% gel, approval # [id number]. [facility]inda [facility] and pt notified of approval.

## 2014-07-25 NOTE — Telephone Encounter (Addendum)
Pt states was seen in our office early this week for u/s, was diagnosed with a short cervix. Pt was started on Progesterone Suppositories.   Pt c/o vaginal pain, "feels like baby is poking and flipping causing pain and discomfort lower pelvic area, no vaginal bleeding, no gush of fluids."  Please advise.

## 2014-08-06 ENCOUNTER — Telehealth: Payer: Self-pay | Admitting: *Deleted

## 2014-08-06 NOTE — Telephone Encounter (Signed)
Pt states she is using the Crinone vaginally for short cervix, c/o pelvic pressure and "white balls the size of a quarter" was seen in under garment. Per Joellyn HaffKim Booker, CNM WNL if pelvic pressure increases from what it has been pt needs to be seen, and the "white balls" are probably Crinone did not dissolve completely. Pt also informed to call our office back for vaginal bleeding. Pt to be seen this Friday for routine appt. Pt verbalized understanding.

## 2014-08-09 ENCOUNTER — Ambulatory Visit (INDEPENDENT_AMBULATORY_CARE_PROVIDER_SITE_OTHER): Payer: Medicaid Other | Admitting: Obstetrics & Gynecology

## 2014-08-09 ENCOUNTER — Other Ambulatory Visit: Payer: Self-pay | Admitting: Obstetrics & Gynecology

## 2014-08-09 ENCOUNTER — Ambulatory Visit (INDEPENDENT_AMBULATORY_CARE_PROVIDER_SITE_OTHER): Payer: Medicaid Other

## 2014-08-09 VITALS — BP 120/60 | HR 80 | Wt 261.0 lb

## 2014-08-09 DIAGNOSIS — O0992 Supervision of high risk pregnancy, unspecified, second trimester: Secondary | ICD-10-CM

## 2014-08-09 DIAGNOSIS — O26872 Cervical shortening, second trimester: Secondary | ICD-10-CM

## 2014-08-09 DIAGNOSIS — Z331 Pregnant state, incidental: Secondary | ICD-10-CM | POA: Diagnosis not present

## 2014-08-09 DIAGNOSIS — Z1389 Encounter for screening for other disorder: Secondary | ICD-10-CM | POA: Diagnosis not present

## 2014-08-09 LAB — POCT URINALYSIS DIPSTICK
Glucose, UA: NEGATIVE
KETONES UA: NEGATIVE
LEUKOCYTES UA: NEGATIVE
Nitrite, UA: NEGATIVE
PROTEIN UA: NEGATIVE
RBC UA: NEGATIVE

## 2014-08-09 NOTE — Progress Notes (Signed)
Fetal Surveillance Testing today:  Sonogram is read and reviewed   High Risk Pregnancy Diagnosis(es):    Short Cervix in the 2nd trimester  G1P0 7063w4d Estimated Date of Delivery: 12/02/14  Blood pressure 120/60, pulse 80, weight 261 lb (118.389 kg), last menstrual period 02/19/2014.  Urinalysis: Negative   HPI: The patient is being seen today for ongoing management of short cervix, assymptomatic. Today she reports no bleeding no pelvic pressure   BP weight and urine results all reviewed and noted. Patient reports good fetal movement, denies any bleeding and no rupture of membranes symptoms or regular contractions.  Fundal Height:  26 Fetal Heart rate:  145 Edema:  none  Patient is without complaints other than noted in her HPI. All questions were answered.  All lab and sonogram results have been reviewed. Comments: abnormal: cervix stable 1.5 cm no funnelling   Assessment:  1.  Pregnancy at 5563w4d,  Estimated Date of Delivery: 12/02/14 :                          2.  Short cervix: stable Crinone 8% 90 mg nightly                        3.    Medication(s) Plans:  As above  Treatment Plan:  Repeat sonogram 2 weeks and continue pelvic rest and nightly crinone 8% 90 mg   Follow up in 2 weeks for appointment for high risk OB care, sonogram

## 2014-08-09 NOTE — Progress Notes (Signed)
U/S(23+4wks)-vtx fetus FHR- 162 bpm, CX=1.5cm closed with funnel present, no change noted with fundal pressure applied

## 2014-08-22 ENCOUNTER — Ambulatory Visit (INDEPENDENT_AMBULATORY_CARE_PROVIDER_SITE_OTHER): Payer: Medicaid Other | Admitting: Obstetrics & Gynecology

## 2014-08-22 ENCOUNTER — Ambulatory Visit (INDEPENDENT_AMBULATORY_CARE_PROVIDER_SITE_OTHER): Payer: Medicaid Other

## 2014-08-22 ENCOUNTER — Other Ambulatory Visit: Payer: Self-pay | Admitting: Obstetrics & Gynecology

## 2014-08-22 ENCOUNTER — Encounter: Payer: Self-pay | Admitting: Obstetrics & Gynecology

## 2014-08-22 VITALS — BP 120/60 | HR 72 | Wt 265.0 lb

## 2014-08-22 DIAGNOSIS — O26872 Cervical shortening, second trimester: Secondary | ICD-10-CM

## 2014-08-22 DIAGNOSIS — O0992 Supervision of high risk pregnancy, unspecified, second trimester: Secondary | ICD-10-CM | POA: Diagnosis not present

## 2014-08-22 DIAGNOSIS — Z331 Pregnant state, incidental: Secondary | ICD-10-CM | POA: Diagnosis not present

## 2014-08-22 DIAGNOSIS — Z1389 Encounter for screening for other disorder: Secondary | ICD-10-CM | POA: Diagnosis not present

## 2014-08-22 LAB — POCT URINALYSIS DIPSTICK
Blood, UA: NEGATIVE
Glucose, UA: NEGATIVE
Ketones, UA: NEGATIVE
LEUKOCYTES UA: NEGATIVE
NITRITE UA: NEGATIVE
Protein, UA: NEGATIVE

## 2014-08-22 NOTE — Progress Notes (Signed)
U/S(25+3wks)-vtx active fetus, EFW 1 lb 12 oz (45th%tile), fluid WNL, SDP- 4.0cm, CX- 2.5cm closed

## 2014-08-22 NOTE — Progress Notes (Signed)
Fetal Surveillance Testing today:  None   High Risk Pregnancy Diagnosis(es):   Short cervix, asymptomatic  G1P0 1339w3d Estimated Date of Delivery: 12/02/14  Blood pressure 120/60, pulse 72, weight 265 lb (120.203 kg), last menstrual period 02/19/2014.  Urinalysis: Negative   HPI: The patient is being seen today for ongoing management of short cervix, asymptomatic in the second trimester. Today she reports no specific issues a little backache and pelvic pressure   BP weight and urine results all reviewed and noted. Patient reports good fetal movement, denies any bleeding and no rupture of membranes symptoms or regular contractions.  Fundal Height:  27 Fetal Heart rate:  145   Edema:  None  Patient is without complaints other than noted in her HPI. All questions were answered.  All lab and sonogram results have been reviewed. Comments: abnormal: Short cervix   Assessment:  1.  Pregnancy at 7239w3d,  Estimated Date of Delivery: 12/02/14 :                          2.  Asymptomatic short cervix and the second trimester and crinone                        3.    Medication(s) Plans:  Continue vaginal crinone lately  Treatment Plan:  As above  Follow up in 2 weeks weeks for appointment for high risk OB care, sonogram

## 2014-08-28 LAB — US OB FOLLOW UP

## 2014-09-09 ENCOUNTER — Other Ambulatory Visit: Payer: Self-pay | Admitting: Obstetrics & Gynecology

## 2014-09-09 DIAGNOSIS — O26872 Cervical shortening, second trimester: Secondary | ICD-10-CM

## 2014-09-10 ENCOUNTER — Other Ambulatory Visit: Payer: Medicaid Other

## 2014-09-10 ENCOUNTER — Ambulatory Visit (INDEPENDENT_AMBULATORY_CARE_PROVIDER_SITE_OTHER): Payer: Medicaid Other | Admitting: Obstetrics & Gynecology

## 2014-09-10 ENCOUNTER — Ambulatory Visit (INDEPENDENT_AMBULATORY_CARE_PROVIDER_SITE_OTHER): Payer: Medicaid Other

## 2014-09-10 ENCOUNTER — Encounter: Payer: Self-pay | Admitting: Obstetrics & Gynecology

## 2014-09-10 VITALS — BP 120/60 | HR 72 | Wt 268.0 lb

## 2014-09-10 DIAGNOSIS — Z1389 Encounter for screening for other disorder: Secondary | ICD-10-CM

## 2014-09-10 DIAGNOSIS — Z3402 Encounter for supervision of normal first pregnancy, second trimester: Secondary | ICD-10-CM

## 2014-09-10 DIAGNOSIS — O0992 Supervision of high risk pregnancy, unspecified, second trimester: Secondary | ICD-10-CM | POA: Diagnosis not present

## 2014-09-10 DIAGNOSIS — Z369 Encounter for antenatal screening, unspecified: Secondary | ICD-10-CM

## 2014-09-10 DIAGNOSIS — O26872 Cervical shortening, second trimester: Secondary | ICD-10-CM | POA: Diagnosis not present

## 2014-09-10 DIAGNOSIS — Z331 Pregnant state, incidental: Secondary | ICD-10-CM

## 2014-09-10 DIAGNOSIS — Z131 Encounter for screening for diabetes mellitus: Secondary | ICD-10-CM

## 2014-09-10 LAB — POCT URINALYSIS DIPSTICK
GLUCOSE UA: NEGATIVE
Ketones, UA: NEGATIVE
Leukocytes, UA: NEGATIVE
NITRITE UA: NEGATIVE
Protein, UA: NEGATIVE
RBC UA: NEGATIVE

## 2014-09-10 LAB — US OB FOLLOW UP

## 2014-09-10 NOTE — Addendum Note (Signed)
Addended by: Colen DarlingYOUNG, Harlyn Italiano S on: 09/10/2014 09:12 AM   Modules accepted: Orders

## 2014-09-10 NOTE — Progress Notes (Signed)
US CX length 2.56cm,cephalic,afi 12.5cm,ant pl grade 1,fht 144bpm,EFW 1188g (2lbs 10)52.3% c/w dates,bilat adnexa wnl

## 2014-09-10 NOTE — Progress Notes (Signed)
Fetal Surveillance Testing today:  Sonogram is normal, cervix is stable see report   High Risk Pregnancy Diagnosis(es):   Short cervix, assymptomatic  G1P0 6347w1d Estimated Date of Delivery: 12/02/14  Blood pressure 120/60, pulse 72, weight 268 lb (121.564 kg), last menstrual period 02/19/2014.  Urinalysis: Negative   HPI: The patient is being seen today for ongoing management of short cervix. Today she reports pelvic pressure is stable   BP weight and urine results all reviewed and noted. Patient reports good fetal movement, denies any bleeding and no rupture of membranes symptoms or regular contractions.  Fundal Height:  28 Fetal Heart rate:  144 Edema:  none  Patient is without complaints other than noted in her HPI. All questions were answered.  All lab and sonogram results have been reviewed. Comments: normal   Assessment:  1.  Pregnancy at 5447w1d,  Estimated Date of Delivery: 12/02/14 :                          2.  Short cervix                         Medication(s) Plans:  Continue crinone vaginally  Treatment Plan:  As above  Follow up in 2 weeks for appointment for high risk OB care,

## 2014-09-10 NOTE — Addendum Note (Signed)
Addended by: Criss AlvinePULLIAM, CHRYSTAL G on: 09/10/2014 10:59 AM   Modules accepted: Orders

## 2014-09-11 LAB — CBC
HCT: 35.9 % (ref 34.0–46.6)
HEMOGLOBIN: 12.2 g/dL (ref 11.1–15.9)
MCH: 28.6 pg (ref 26.6–33.0)
MCHC: 34 g/dL (ref 31.5–35.7)
MCV: 84 fL (ref 79–97)
Platelets: 348 10*3/uL (ref 150–379)
RBC: 4.26 x10E6/uL (ref 3.77–5.28)
RDW: 13.8 % (ref 12.3–15.4)
WBC: 12 10*3/uL — AB (ref 3.4–10.8)

## 2014-09-11 LAB — HSV 2 ANTIBODY, IGG

## 2014-09-11 LAB — RPR: RPR: NONREACTIVE

## 2014-09-11 LAB — HIV ANTIBODY (ROUTINE TESTING W REFLEX): HIV Screen 4th Generation wRfx: NONREACTIVE

## 2014-09-11 LAB — ANTIBODY SCREEN: Antibody Screen: NEGATIVE

## 2014-09-11 LAB — GLUCOSE TOLERANCE, 1 HOUR: Glucose, 1Hr PP: 108 mg/dL (ref 65–199)

## 2014-09-12 ENCOUNTER — Other Ambulatory Visit: Payer: Medicaid Other

## 2014-09-20 ENCOUNTER — Telehealth: Payer: Self-pay | Admitting: Obstetrics & Gynecology

## 2014-09-20 NOTE — Telephone Encounter (Signed)
Pt states yesterday morning clear to white fluid ran down her leg. Pt states she is on Crinone suppositories. +FM, no vaginal bleeding, no increase pain or pressure. Pt informed continue to monitor probably were she is using the Crinone Suppositories, [facility] Pt verbalized understanding.

## 2014-09-24 ENCOUNTER — Ambulatory Visit (INDEPENDENT_AMBULATORY_CARE_PROVIDER_SITE_OTHER): Payer: Medicaid Other | Admitting: Obstetrics & Gynecology

## 2014-09-24 ENCOUNTER — Ambulatory Visit (INDEPENDENT_AMBULATORY_CARE_PROVIDER_SITE_OTHER): Payer: Medicaid Other

## 2014-09-24 ENCOUNTER — Encounter: Payer: Self-pay | Admitting: Obstetrics & Gynecology

## 2014-09-24 VITALS — BP 118/60 | HR 72 | Wt 267.0 lb

## 2014-09-24 DIAGNOSIS — Z331 Pregnant state, incidental: Secondary | ICD-10-CM

## 2014-09-24 DIAGNOSIS — Z1389 Encounter for screening for other disorder: Secondary | ICD-10-CM | POA: Diagnosis not present

## 2014-09-24 DIAGNOSIS — Z3403 Encounter for supervision of normal first pregnancy, third trimester: Secondary | ICD-10-CM

## 2014-09-24 DIAGNOSIS — O26872 Cervical shortening, second trimester: Secondary | ICD-10-CM | POA: Diagnosis not present

## 2014-09-24 DIAGNOSIS — O0993 Supervision of high risk pregnancy, unspecified, third trimester: Secondary | ICD-10-CM | POA: Diagnosis not present

## 2014-09-24 DIAGNOSIS — O0992 Supervision of high risk pregnancy, unspecified, second trimester: Secondary | ICD-10-CM | POA: Diagnosis not present

## 2014-09-24 LAB — POCT URINALYSIS DIPSTICK
GLUCOSE UA: NEGATIVE
KETONES UA: NEGATIVE
LEUKOCYTES UA: NEGATIVE
Nitrite, UA: NEGATIVE
RBC UA: NEGATIVE

## 2014-09-24 NOTE — Progress Notes (Signed)
Fetal Surveillance Testing today:  Sonogram is normal   High Risk Pregnancy Diagnosis(es):   Short Cervix  G1P0 1571w1d Estimated Date of Delivery: 12/02/14  Blood pressure 118/60, pulse 72, weight 267 lb (121.11 kg), last menstrual period 02/19/2014.  Urinalysis: Negative   HPI: The patient is being seen today for ongoing management of short cervix. Today she reports intermittent hives   BP weight and urine results all reviewed and noted. Patient reports good fetal movement, denies any bleeding and no rupture of membranes symptoms or regular contractions.  Fundal Height:  32 Fetal Heart rate:  163 Edema:  none  Patient is without complaints other than noted in her HPI. All questions were answered.  All lab and sonogram results have been reviewed. Comments: abnormal: short cervix but stable   Assessment:  1.  Pregnancy at 2271w1d,  Estimated Date of Delivery: 12/02/14 :                          2.  Short cervix                        3.    Medication(s) Plans:  Continue crinone  Treatment Plan:  No changes  Follow up in 2 weeks for appointment for high risk OB care,

## 2014-09-24 NOTE — Progress Notes (Signed)
US cervical length 2.5 cm w/pressure 2.6 w/o pressure, ant pl gr 2,cephalic,fht163bpm,afi 12cm

## 2014-09-24 NOTE — Addendum Note (Signed)
Addended by: Colen DarlingYOUNG, JANET S on: 09/24/2014 10:53 AM   Modules accepted: Orders

## 2014-10-08 ENCOUNTER — Encounter: Payer: Self-pay | Admitting: Obstetrics & Gynecology

## 2014-10-08 ENCOUNTER — Ambulatory Visit (INDEPENDENT_AMBULATORY_CARE_PROVIDER_SITE_OTHER): Payer: Medicaid Other | Admitting: Obstetrics & Gynecology

## 2014-10-08 VITALS — BP 110/80 | HR 100 | Wt 268.0 lb

## 2014-10-08 DIAGNOSIS — O26872 Cervical shortening, second trimester: Secondary | ICD-10-CM | POA: Diagnosis not present

## 2014-10-08 DIAGNOSIS — Z331 Pregnant state, incidental: Secondary | ICD-10-CM

## 2014-10-08 DIAGNOSIS — A499 Bacterial infection, unspecified: Secondary | ICD-10-CM | POA: Diagnosis not present

## 2014-10-08 DIAGNOSIS — B9689 Other specified bacterial agents as the cause of diseases classified elsewhere: Secondary | ICD-10-CM

## 2014-10-08 DIAGNOSIS — Z1389 Encounter for screening for other disorder: Secondary | ICD-10-CM | POA: Diagnosis not present

## 2014-10-08 DIAGNOSIS — N760 Acute vaginitis: Secondary | ICD-10-CM

## 2014-10-08 DIAGNOSIS — O0993 Supervision of high risk pregnancy, unspecified, third trimester: Secondary | ICD-10-CM

## 2014-10-08 LAB — POCT URINALYSIS DIPSTICK
GLUCOSE UA: NEGATIVE
KETONES UA: NEGATIVE
LEUKOCYTES UA: NEGATIVE
NITRITE UA: NEGATIVE
Protein, UA: NEGATIVE
RBC UA: NEGATIVE

## 2014-10-08 MED ORDER — METRONIDAZOLE 500 MG PO TABS: 500.0000 mg | ORAL_TABLET | Freq: Two times a day (BID) | ORAL | Status: DC

## 2014-10-08 NOTE — Progress Notes (Signed)
Fetal Surveillance Testing today:  none   High Risk Pregnancy Diagnosis(es):   Short cervix on crinone  G1P0 10385w1d Estimated Date of Delivery: 12/02/14  Blood pressure 110/80, pulse 100, weight 268 lb (121.564 kg), last menstrual period 02/19/2014.  Urinalysis: Negative   HPI: The patient is being seen today for ongoing management of short cervix Today she reports malodorous discharge   BP weight and urine results all reviewed and noted. Patient reports good fetal movement, denies any bleeding and no rupture of membranes symptoms or regular contractions.  Fundal Height:  32 Fetal Heart rate:  145 Edema:  trace  Patient is without complaints other than noted in her HPI. All questions were answered.  All lab and sonogram results have been reviewed. Comments: abnormal: Wet Prep:   A sample of vaginal discharge was obtained from the posterior fornix using a cotton swab. 2 drops of saline were placed on a slide and the cotton swab was immersed in the saline. Microscopic evaluation was performed and results were as follows:  Negative  for yeast  Positive for clue cells , consistent with Bacterial vaginosis Negative for trichomonas  Normal WBC population   Whiff test: Negative    Assessment:  1.  Pregnancy at 1385w1d,  Estimated Date of Delivery: 12/02/14 :                          2.  Short cervix                        3.  BV  Medication(s) Plans:  Metronidazole 500 BID x 7  Treatment Plan:  Continue crinone  Follow up in 2 weeks for appointment for high risk OB care,

## 2014-10-21 ENCOUNTER — Telehealth: Payer: Self-pay | Admitting: *Deleted

## 2014-10-21 NOTE — Telephone Encounter (Signed)
[facility], Teacher, early years/preharmacist notified PA completed and Crinone 8% gel approved. PA # [id number].

## 2014-10-22 ENCOUNTER — Encounter: Payer: Self-pay | Admitting: Obstetrics & Gynecology

## 2014-10-22 ENCOUNTER — Ambulatory Visit (INDEPENDENT_AMBULATORY_CARE_PROVIDER_SITE_OTHER): Payer: Medicaid Other | Admitting: Obstetrics & Gynecology

## 2014-10-22 VITALS — BP 112/64 | HR 88 | Wt 272.0 lb

## 2014-10-22 DIAGNOSIS — O0993 Supervision of high risk pregnancy, unspecified, third trimester: Secondary | ICD-10-CM | POA: Diagnosis not present

## 2014-10-22 DIAGNOSIS — Z1389 Encounter for screening for other disorder: Secondary | ICD-10-CM | POA: Diagnosis not present

## 2014-10-22 DIAGNOSIS — Z331 Pregnant state, incidental: Secondary | ICD-10-CM | POA: Diagnosis not present

## 2014-10-22 DIAGNOSIS — O26873 Cervical shortening, third trimester: Secondary | ICD-10-CM | POA: Diagnosis not present

## 2014-10-22 LAB — POCT URINALYSIS DIPSTICK
GLUCOSE UA: NEGATIVE
Ketones, UA: NEGATIVE
Leukocytes, UA: NEGATIVE
Nitrite, UA: NEGATIVE
Protein, UA: NEGATIVE
RBC UA: NEGATIVE

## 2014-10-22 NOTE — Progress Notes (Signed)
Fetal Surveillance Testing today:  none   High Risk Pregnancy Diagnosis(es):   Cervical insufficiency  G1P0 5036w1d Estimated Date of Delivery: 12/02/14  Blood pressure 112/64, pulse 88, weight 272 lb (123.378 kg), last menstrual period 02/19/2014.  Urinalysis: Negative   HPI: The patient is being seen today for ongoing management of cervical insufficiency. Today she reports feeling well. No bleeding, LOF, contractions. Good FM   BP weight and urine results all reviewed and noted. Patient reports good fetal movement, denies any bleeding and no rupture of membranes symptoms or regular contractions.  Fundal Height:  35cm Fetal Heart rate:  150bpm Edema:  trace  Patient is without complaints other than noted in her HPI. All questions were answered.  All lab and sonogram results have been reviewed. Comments: normal   Assessment:  1.  Pregnancy at 3336w1d,  Estimated Date of Delivery: 12/02/14 :                          2.  Cervical shortening, on crinone vaginal                        3.    Medication(s) Plans:  No change  Treatment Plan:  No changes, continue crinone  Follow up in 2 weeks for appointment for high risk OB care,

## 2014-10-22 NOTE — Progress Notes (Signed)
Pt states that the crinone that she takes usually comes out white but recently has been coming greenish, pt wants to know if that is normal

## 2014-10-23 ENCOUNTER — Inpatient Hospital Stay (HOSPITAL_COMMUNITY)
Admission: AD | Admit: 2014-10-23 | Discharge: 2014-10-24 | Disposition: A | Discharge status: Home / Self Care | Payer: Medicaid Other | Source: Ambulatory Visit | Attending: Obstetrics & Gynecology | Admitting: Obstetrics & Gynecology

## 2014-10-23 ENCOUNTER — Encounter (HOSPITAL_COMMUNITY): Payer: Self-pay

## 2014-10-23 DIAGNOSIS — R109 Unspecified abdominal pain: Secondary | ICD-10-CM | POA: Diagnosis present

## 2014-10-23 DIAGNOSIS — Z833 Family history of diabetes mellitus: Secondary | ICD-10-CM | POA: Insufficient documentation

## 2014-10-23 DIAGNOSIS — Z87891 Personal history of nicotine dependence: Secondary | ICD-10-CM | POA: Diagnosis not present

## 2014-10-23 DIAGNOSIS — O4703 False labor before 37 completed weeks of gestation, third trimester: Secondary | ICD-10-CM | POA: Insufficient documentation

## 2014-10-23 DIAGNOSIS — Z87442 Personal history of urinary calculi: Secondary | ICD-10-CM | POA: Diagnosis not present

## 2014-10-23 DIAGNOSIS — Z3A34 34 weeks gestation of pregnancy: Secondary | ICD-10-CM | POA: Insufficient documentation

## 2014-10-23 DIAGNOSIS — J45909 Unspecified asthma, uncomplicated: Secondary | ICD-10-CM | POA: Diagnosis not present

## 2014-10-23 DIAGNOSIS — O99513 Diseases of the respiratory system complicating pregnancy, third trimester: Secondary | ICD-10-CM | POA: Diagnosis not present

## 2014-10-23 DIAGNOSIS — Z8249 Family history of ischemic heart disease and other diseases of the circulatory system: Secondary | ICD-10-CM | POA: Insufficient documentation

## 2014-10-23 DIAGNOSIS — O479 False labor, unspecified: Secondary | ICD-10-CM

## 2014-10-23 LAB — URINALYSIS, ROUTINE W REFLEX MICROSCOPIC
Bilirubin Urine: NEGATIVE
GLUCOSE, UA: NEGATIVE mg/dL
HGB URINE DIPSTICK: NEGATIVE
Ketones, ur: NEGATIVE mg/dL
Nitrite: NEGATIVE
PROTEIN: NEGATIVE mg/dL
Specific Gravity, Urine: 1.025 (ref 1.005–1.030)
Urobilinogen, UA: 0.2 mg/dL (ref 0.0–1.0)
pH: 6 (ref 5.0–8.0)

## 2014-10-23 LAB — URINE MICROSCOPIC-ADD ON

## 2014-10-23 NOTE — MAU Note (Signed)
Got down on her knees to get something out from under cabinet and felt a pain in abd. Then having mid abd tightness, mostly with activity.  Still having a sharp pulling pain in abd.  No bleeding. No leaking. Baby moving well.

## 2014-10-24 DIAGNOSIS — Z3A34 34 weeks gestation of pregnancy: Secondary | ICD-10-CM | POA: Diagnosis not present

## 2014-10-24 DIAGNOSIS — O4703 False labor before 37 completed weeks of gestation, third trimester: Secondary | ICD-10-CM

## 2014-10-24 NOTE — Discharge Instructions (Signed)
Braxton Hicks Contractions °Contractions of the uterus can occur throughout pregnancy. Contractions are not always a sign that you are in labor.  °WHAT ARE BRAXTON HICKS CONTRACTIONS?  °Contractions that occur before labor are called Braxton Hicks contractions, or false labor. Toward the end of pregnancy (32-34 weeks), these contractions can develop more often and may become more forceful. This is not true labor because these contractions do not result in opening (dilatation) and thinning of the cervix. They are sometimes difficult to tell apart from true labor because these contractions can be forceful and people have different pain tolerances. You should not feel embarrassed if you go to the hospital with false labor. Sometimes, the only way to tell if you are in true labor is for your health care provider to look for changes in the cervix. °If there are no prenatal problems or other health problems associated with the pregnancy, it is completely safe to be sent home with false labor and await the onset of true labor. °HOW CAN YOU TELL THE DIFFERENCE BETWEEN TRUE AND FALSE LABOR? °False Labor °· The contractions of false labor are usually shorter and not as hard as those of true labor.   °· The contractions are usually irregular.   °· The contractions are often felt in the front of the lower abdomen and in the groin.   °· The contractions may go away when you walk around or change positions while lying down.   °· The contractions get weaker and are shorter lasting as time goes on.   °· The contractions do not usually become progressively stronger, regular, and closer together as with true labor.   °True Labor °· Contractions in true labor last 30-70 seconds, become very regular, usually become more intense, and increase in frequency.   °· The contractions do not go away with walking.   °· The discomfort is usually felt in the top of the uterus and spreads to the lower abdomen and low back.   °· True labor can be  determined by your health care provider with an exam. This will show that the cervix is dilating and getting thinner.   °WHAT TO REMEMBER °· Keep up with your usual exercises and follow other instructions given by your health care provider.   °· Take medicines as directed by your health care provider.   °· Keep your regular prenatal appointments.   °· Eat and drink lightly if you think you are going into labor.   °· If Braxton Hicks contractions are making you uncomfortable:   °¨ Change your position from lying down or resting to walking, or from walking to resting.   °¨ Sit and rest in a tub of warm water.   °¨ Drink 2-3 glasses of water. Dehydration may cause these contractions.   °¨ Do slow and deep breathing several times an hour.   °WHEN SHOULD I SEEK IMMEDIATE MEDICAL CARE? °Seek immediate medical care if: °· Your contractions become stronger, more regular, and closer together.   °· You have fluid leaking or gushing from your vagina.   °· You have a fever.   °· You pass blood-tinged mucus.   °· You have vaginal bleeding.   °· You have continuous abdominal pain.   °· You have low back pain that you never had before.   °· You feel your baby's head pushing down and causing pelvic pressure.   °· Your baby is not moving as much as it used to.   °Document Released: 05/17/2005 Document Revised: 05/22/2013 Document Reviewed: 02/26/2013 °Exit[employer]® Patient Information ©2015 [employer]. This information is not intended to replace advice given to you by your health care   provider. Make sure you discuss any questions you have with your health care provider. ° °

## 2014-10-24 NOTE — MAU Provider Note (Signed)
History     CSN: [id number]  Arrival date and time: 10/23/14 2314   None     Chief Complaint  Patient presents with  . Abdominal Pain   HPI [name] is a 29yo G1 @ 34.3wks by 6wk scan who presents for eval of sharp pain around umbilicus occuring intermittently this evening along with mid abd tightening. Nothing relieves this discomfort. It is not associated w/ vag bldg or leaking. Reports +FM. No N/V/D, H/A or visual disturbances. No dysuria or fever. [facility] and has been remarkable for 1) shortened cx 2) hx migranes 3) hx childhood asthma 4) prev smoker 5) obesity  OB History    Gravida Para Term Preterm AB TAB SAB Ectopic Multiple Living   1               Past Medical History  Diagnosis Date  . Asthma since birth   . Obesity   . Kidney stone     Past Surgical History  Procedure Laterality Date  . No past surgeries      Family History  Problem Relation Age of Onset  . Hyperlipidemia Mother   . Hypertension Father   . Diabetes Father   . Cancer Maternal Grandfather     History  Substance Use Topics  . Smoking status: Former Smoker -- 0.50 packs/day    Types: Cigarettes  . Smokeless tobacco: Never Used  . Alcohol Use: No    Allergies:  Allergies  Allergen Reactions  . Peanuts [Peanut Oil] Swelling    Prescriptions prior to admission  Medication Sig Dispense Refill Last Dose  . Prenatal Vit-Fe Fumarate-FA (MULTIVITAMIN-PRENATAL) 27-0.8 MG TABS tablet Take 1 tablet by mouth daily at 12 noon.   10/23/2014 at Unknown time  . PROGESTERONE, VAGINAL, 8 % GEL Place 90 mg vaginally at bedtime. 1.125 g 11 10/22/2014 at Unknown time    ROS Physical Exam   Blood pressure 137/75, pulse 82, temperature 98 F (36.7 C), temperature source Oral, resp. rate 16, last menstrual period 02/19/2014, SpO2 100 %.  Physical Exam  Constitutional: She is oriented to person, place, and time. She appears well-developed.  obese   HENT:  Head: Normocephalic.  Neck: Normal range of motion.  Cardiovascular: Normal rate.   Respiratory: Effort normal.  GI:  EFM 130s, +accels, no decels No ctx per toco  Abd- gravid, NT  Genitourinary: Vagina normal.  Cx closed/50%/post; clumping of progesterone gel noted in vag  Musculoskeletal: Normal range of motion.  Neurological: She is alert and oriented to person, place, and time.  Skin: Skin is warm and dry.  Psychiatric: She has a normal mood and affect. Her behavior is normal. Thought content normal.   Urinalysis    Component Value Date/Time   COLORURINE YELLOW 10/23/2014 2330   APPEARANCEUR CLEAR 10/23/2014 2330   LABSPEC 1.025 10/23/2014 2330   PHURINE 6.0 10/23/2014 2330   GLUCOSEU NEGATIVE 10/23/2014 2330   HGBUR NEGATIVE 10/23/2014 2330   BILIRUBINUR NEGATIVE 10/23/2014 2330   KETONESUR NEGATIVE 10/23/2014 2330   PROTEINUR NEGATIVE 10/23/2014 2330   PROTEINUR neg 10/22/2014 1028   UROBILINOGEN 0.2 10/23/2014 2330   NITRITE NEGATIVE 10/23/2014 2330   NITRITE neg 10/22/2014 1028   LEUKOCYTESUR SMALL* 10/23/2014 2330   Micro: few SE, few bact  MAU Course  Procedures  MDM Read NST Cx exam Ordered UA  Assessment and Plan  IUP@ 34.3wks Braxton Hicks ctx  D/C home with preterm labor precautions/ROM/bldg  Comfort meas rev'd Keep next scheduled visit at [facility] on 11/05/14  Cam Hai CNM 10/24/2014, 12:21 AM

## 2014-11-05 ENCOUNTER — Encounter: Payer: Self-pay | Admitting: Obstetrics & Gynecology

## 2014-11-05 ENCOUNTER — Ambulatory Visit (INDEPENDENT_AMBULATORY_CARE_PROVIDER_SITE_OTHER): Payer: Medicaid Other | Admitting: Obstetrics & Gynecology

## 2014-11-05 VITALS — BP 110/60 | HR 72 | Wt 277.0 lb

## 2014-11-05 DIAGNOSIS — Z1389 Encounter for screening for other disorder: Secondary | ICD-10-CM

## 2014-11-05 DIAGNOSIS — Z331 Pregnant state, incidental: Secondary | ICD-10-CM

## 2014-11-05 DIAGNOSIS — O26873 Cervical shortening, third trimester: Secondary | ICD-10-CM

## 2014-11-05 DIAGNOSIS — O0993 Supervision of high risk pregnancy, unspecified, third trimester: Secondary | ICD-10-CM

## 2014-11-05 NOTE — Progress Notes (Signed)
Fetal Surveillance Testing today:  none   High Risk Pregnancy Diagnosis(es):   Short cervix(stop crinone next week)  G1P0 929w1d Estimated Date of Delivery: 12/02/14  Blood pressure 110/60, pulse 72, weight 277 lb (125.646 kg), last menstrual period 02/19/2014.  Urinalysis: Negative   HPI: The patient is being seen today for ongoing management of short cervix. Today she reports some pelvic pressure   BP weight and urine results all reviewed and noted. Patient reports good fetal movement, denies any bleeding and no rupture of membranes symptoms or regular contractions.  Fundal Height:  38 Fetal Heart rate:  160 Edema:  none  Patient is without complaints other than noted in her HPI. All questions were answered.  All lab and sonogram results have been reviewed. Comments: abnormal: short cervix   Assessment:  1.  Pregnancy at 4929w1d,  Estimated Date of Delivery: 12/02/14 :                          2.  Short cervix                        3.    Medication(s) Plans:  No changes, stop crinone in 1 week  Treatment Plan:  As above  Follow up in 1 weeks for appointment for high risk OB care, GBS

## 2014-11-06 LAB — POCT URINALYSIS DIPSTICK
GLUCOSE UA: NEGATIVE
KETONES UA: NEGATIVE
Leukocytes, UA: NEGATIVE
NITRITE UA: NEGATIVE
RBC UA: NEGATIVE

## 2014-11-12 ENCOUNTER — Encounter: Payer: Self-pay | Admitting: Obstetrics & Gynecology

## 2014-11-12 ENCOUNTER — Ambulatory Visit (INDEPENDENT_AMBULATORY_CARE_PROVIDER_SITE_OTHER): Payer: Medicaid Other | Admitting: Obstetrics & Gynecology

## 2014-11-12 VITALS — BP 120/50 | HR 84 | Wt 281.0 lb

## 2014-11-12 DIAGNOSIS — O0993 Supervision of high risk pregnancy, unspecified, third trimester: Secondary | ICD-10-CM | POA: Diagnosis not present

## 2014-11-12 DIAGNOSIS — O26873 Cervical shortening, third trimester: Secondary | ICD-10-CM | POA: Diagnosis not present

## 2014-11-12 DIAGNOSIS — Z369 Encounter for antenatal screening, unspecified: Secondary | ICD-10-CM

## 2014-11-12 DIAGNOSIS — Z3685 Encounter for antenatal screening for Streptococcus B: Secondary | ICD-10-CM

## 2014-11-12 DIAGNOSIS — Z1389 Encounter for screening for other disorder: Secondary | ICD-10-CM

## 2014-11-12 DIAGNOSIS — Z331 Pregnant state, incidental: Secondary | ICD-10-CM | POA: Diagnosis not present

## 2014-11-12 LAB — POCT URINALYSIS DIPSTICK
Blood, UA: NEGATIVE
GLUCOSE UA: NEGATIVE
KETONES UA: NEGATIVE
Leukocytes, UA: NEGATIVE
Nitrite, UA: NEGATIVE

## 2014-11-12 LAB — OB RESULTS CONSOLE GBS: STREP GROUP B AG: POSITIVE

## 2014-11-12 NOTE — Addendum Note (Signed)
Addended by: Criss Alvine on: 11/12/2014 11:21 AM   Modules accepted: Orders

## 2014-11-12 NOTE — Progress Notes (Signed)
Fetal Surveillance Testing today:  none   High Risk Pregnancy Diagnosis(es):   Short Cervix, now 37 weeks  G1P0 [redacted]w[redacted]d Estimated Date of Delivery: 12/02/14  Blood pressure 120/50, pulse 84, weight 281 lb (127.461 kg), last menstrual period 02/19/2014.  Urinalysis: Negative   HPI: The patient is being seen today for ongoing management of short cervix. Today she reports no problems   BP weight and urine results all reviewed and noted. Patient reports good fetal movement, denies any bleeding and no rupture of membranes symptoms or regular contractions.  Fundal Height:  41 Fetal Heart rate:  145 Edema:  none  Patient is without complaints other than noted in her HPI. All questions were answered.  All lab and sonogram results have been reviewed. Comments: abnormal: short cervix   Assessment:  1.  Pregnancy at [redacted]w[redacted]d,  Estimated Date of Delivery: 12/02/14 :                          2.  Short cervix                        3.    Medication(s) Plans:  Crinone stopped  Treatment Plan:  Stop crinone  Follow up in 1 weeks for appointment for high risk OB care,

## 2014-11-14 LAB — GC/CHLAMYDIA PROBE AMP
Chlamydia trachomatis, NAA: NEGATIVE
Neisseria gonorrhoeae by PCR: NEGATIVE

## 2014-11-15 LAB — CULTURE, BETA STREP (GROUP B ONLY): Strep Gp B Culture: POSITIVE — AB

## 2014-11-18 DIAGNOSIS — Z029 Encounter for administrative examinations, unspecified: Secondary | ICD-10-CM

## 2014-11-19 ENCOUNTER — Ambulatory Visit (INDEPENDENT_AMBULATORY_CARE_PROVIDER_SITE_OTHER): Payer: Medicaid Other | Admitting: Obstetrics & Gynecology

## 2014-11-19 ENCOUNTER — Encounter: Payer: Self-pay | Admitting: Obstetrics & Gynecology

## 2014-11-19 VITALS — BP 110/50 | HR 84 | Wt 278.0 lb

## 2014-11-19 DIAGNOSIS — O0993 Supervision of high risk pregnancy, unspecified, third trimester: Secondary | ICD-10-CM | POA: Diagnosis not present

## 2014-11-19 DIAGNOSIS — O26873 Cervical shortening, third trimester: Secondary | ICD-10-CM | POA: Diagnosis not present

## 2014-11-19 DIAGNOSIS — Z1389 Encounter for screening for other disorder: Secondary | ICD-10-CM | POA: Diagnosis not present

## 2014-11-19 DIAGNOSIS — Z331 Pregnant state, incidental: Secondary | ICD-10-CM

## 2014-11-19 LAB — POCT URINALYSIS DIPSTICK
Blood, UA: NEGATIVE
Glucose, UA: NEGATIVE
Ketones, UA: NEGATIVE
LEUKOCYTES UA: NEGATIVE
Nitrite, UA: NEGATIVE
PROTEIN UA: NEGATIVE

## 2014-11-19 NOTE — Progress Notes (Signed)
Fetal Surveillance Testing today:  none   High Risk Pregnancy Diagnosis(es):   Short cervix  G1P0 [redacted]w[redacted]d Estimated Date of Delivery: 12/02/14  Blood pressure 110/50, pulse 84, weight 278 lb (126.1 kg), last menstrual period 02/19/2014.  Urinalysis: Negative   HPI: The patient is being seen today for ongoing management of short cervix, crinone now stopped. Today she reports pelvic pressure   BP weight and urine results all reviewed and noted. Patient reports good fetal movement, denies any bleeding and no rupture of membranes symptoms or regular contractions.  Fundal Height:  39 Fetal Heart rate:  142 Edema:  1+  Patient is without complaints other than noted in her HPI. All questions were answered.  All lab and sonogram results have been reviewed. Comments: abnormal: short cervix   Assessment:  1.  Pregnancy at [redacted]w[redacted]d,  Estimated Date of Delivery: 12/02/14 :                          2.  Short cervix                        3.    Medication(s) Plans:  No meds  Treatment Plan:  wekly  Follow up in 1 weeks for appointment for high risk OB care,

## 2014-11-26 ENCOUNTER — Encounter: Payer: Self-pay | Admitting: Obstetrics & Gynecology

## 2014-11-26 ENCOUNTER — Ambulatory Visit (INDEPENDENT_AMBULATORY_CARE_PROVIDER_SITE_OTHER): Payer: Medicaid Other | Admitting: Obstetrics & Gynecology

## 2014-11-26 VITALS — BP 112/70 | HR 76 | Wt 278.2 lb

## 2014-11-26 DIAGNOSIS — Z1389 Encounter for screening for other disorder: Secondary | ICD-10-CM

## 2014-11-26 DIAGNOSIS — O0993 Supervision of high risk pregnancy, unspecified, third trimester: Secondary | ICD-10-CM | POA: Diagnosis not present

## 2014-11-26 DIAGNOSIS — Z331 Pregnant state, incidental: Secondary | ICD-10-CM | POA: Diagnosis not present

## 2014-11-26 DIAGNOSIS — O26873 Cervical shortening, third trimester: Secondary | ICD-10-CM

## 2014-11-26 LAB — POCT URINALYSIS DIPSTICK
Blood, UA: NEGATIVE
GLUCOSE UA: NEGATIVE
Ketones, UA: NEGATIVE
NITRITE UA: NEGATIVE
PROTEIN UA: NEGATIVE

## 2014-11-26 NOTE — Progress Notes (Signed)
Fetal Surveillance Testing today:  none   High Risk Pregnancy Diagnosis(es):   Short cervix  G1P0 4730w1d Estimated Date of Delivery: 12/02/14  Blood pressure 112/70, pulse 76, weight 278 lb 3.2 oz (126.191 kg), last menstrual period 02/19/2014.  Urinalysis: Negative   HPI: The patient is being seen today for ongoing management of short cervix, now not clinically relevant of course. Today she reports pelvic pressure   BP weight and urine results all reviewed and noted. Patient reports good fetal movement, denies any bleeding and no rupture of membranes symptoms or regular contractions.  Fundal Height:  41 Fetal Heart rate:  144 Edema:  none  Patient is without complaints other than noted in her HPI. All questions were answered.  All lab and sonogram results have been reviewed. Comments: normal   Assessment:  1.  Pregnancy at 6630w1d,  Estimated Date of Delivery: 12/02/14 :                          2.  Short cervix                        3.    Medication(s) Plans:  No changes  Treatment Plan:    Follow up in 1 weeks for appointment for high risk OB care,

## 2014-12-03 ENCOUNTER — Encounter: Payer: Self-pay | Admitting: Obstetrics & Gynecology

## 2014-12-03 ENCOUNTER — Ambulatory Visit (INDEPENDENT_AMBULATORY_CARE_PROVIDER_SITE_OTHER): Payer: Medicaid Other | Admitting: Obstetrics & Gynecology

## 2014-12-03 VITALS — BP 126/70 | HR 80 | Wt 282.0 lb

## 2014-12-03 DIAGNOSIS — O26873 Cervical shortening, third trimester: Secondary | ICD-10-CM

## 2014-12-03 DIAGNOSIS — Z1389 Encounter for screening for other disorder: Secondary | ICD-10-CM

## 2014-12-03 DIAGNOSIS — O0993 Supervision of high risk pregnancy, unspecified, third trimester: Secondary | ICD-10-CM

## 2014-12-03 DIAGNOSIS — Z331 Pregnant state, incidental: Secondary | ICD-10-CM | POA: Diagnosis not present

## 2014-12-03 LAB — POCT URINALYSIS DIPSTICK
Glucose, UA: NEGATIVE
Ketones, UA: NEGATIVE
Leukocytes, UA: NEGATIVE
Nitrite, UA: NEGATIVE
PROTEIN UA: NEGATIVE

## 2014-12-03 NOTE — Progress Notes (Signed)
Fetal Surveillance Testing today:  none   High Risk Pregnancy Diagnosis(es):   Short cervix  G1P0 6744w1d Estimated Date of Delivery: 12/02/14  Blood pressure 126/70, pulse 80, weight 282 lb (127.914 kg), last menstrual period 02/19/2014.  Urinalysis: Negative   HPI: The patient is being seen today for ongoing management of short cervix. Today she reports cervical mucous passing   BP weight and urine results all reviewed and noted. Patient reports good fetal movement, denies any bleeding and no rupture of membranes symptoms or regular contractions.  Fundal Height:  41 Fetal Heart rate:  155 Edema:  1+  Patient is without complaints other than noted in her HPI. All questions were answered.  All lab and sonogram results have been reviewed. Comments:    Assessment:  1.  Pregnancy at 844w1d,  Estimated Date of Delivery: 12/02/14 :                          2.  Short cervix                        3.  Impending post dates  Medication(s) Plans:  No changes  Treatment Plan:  Induction at 12/08/2104 @2355   Follow up in 6 weeks for appointment for high risk OB care, induction

## 2014-12-04 ENCOUNTER — Encounter (HOSPITAL_COMMUNITY): Payer: Self-pay | Admitting: *Deleted

## 2014-12-04 ENCOUNTER — Telehealth: Payer: Self-pay | Admitting: Obstetrics & Gynecology

## 2014-12-04 ENCOUNTER — Telehealth (HOSPITAL_COMMUNITY): Payer: Self-pay | Admitting: *Deleted

## 2014-12-04 NOTE — Telephone Encounter (Signed)
Preadmission screen  

## 2014-12-04 NOTE — Telephone Encounter (Signed)
Pt states saw Dr. Despina HiddenEure yesterday and digital exam was performed now having brownish spotting is this normal? Pt informed can have spotting after digital exam, continue to monitor. Reviewed labor precautions. Pt verbalized understanding. Pt states induction scheduled for 12/09/2014.

## 2014-12-05 ENCOUNTER — Encounter (HOSPITAL_COMMUNITY): Payer: Self-pay | Admitting: *Deleted

## 2014-12-05 ENCOUNTER — Inpatient Hospital Stay (HOSPITAL_COMMUNITY)
Admission: AD | Admit: 2014-12-05 | Discharge: 2014-12-05 | Disposition: A | Discharge status: Home / Self Care | Payer: Medicaid Other | Source: Ambulatory Visit | Attending: Family Medicine | Admitting: Family Medicine

## 2014-12-05 DIAGNOSIS — R109 Unspecified abdominal pain: Secondary | ICD-10-CM | POA: Diagnosis present

## 2014-12-05 DIAGNOSIS — O9989 Other specified diseases and conditions complicating pregnancy, childbirth and the puerperium: Secondary | ICD-10-CM | POA: Diagnosis not present

## 2014-12-05 DIAGNOSIS — Z3403 Encounter for supervision of normal first pregnancy, third trimester: Secondary | ICD-10-CM

## 2014-12-05 DIAGNOSIS — Z87891 Personal history of nicotine dependence: Secondary | ICD-10-CM | POA: Diagnosis not present

## 2014-12-05 DIAGNOSIS — Z3A40 40 weeks gestation of pregnancy: Secondary | ICD-10-CM | POA: Insufficient documentation

## 2014-12-05 DIAGNOSIS — Z87442 Personal history of urinary calculi: Secondary | ICD-10-CM | POA: Diagnosis not present

## 2014-12-05 NOTE — MAU Provider Note (Signed)
History     CSN: [id number]  Arrival date and time: 12/05/14 1450   First Provider Initiated Contact with Patient 12/05/14 1525     CC: Abdominal Pain   HPI   Patient is 29 y.o. G1P0 5468w3d here with complaints of abdominal pain. Pt states that she had ctx like pain once at 2 am and at 4 am this morning. Pain was tight and cramping across abdomen.   Pt is scheduled for IOL 2/2 postdates.on 7/11.  +FM, denies LOF, VB, contractions, vaginal discharge.    OB History    Gravida Para Term Preterm AB TAB SAB Ectopic Multiple Living   1               Past Medical History  Diagnosis Date  . Asthma since birth   . Obesity   . Kidney stone     Past Surgical History  Procedure Laterality Date  . No past surgeries      Family History  Problem Relation Age of Onset  . Hyperlipidemia Mother   . Hypertension Father   . Diabetes Father   . Cancer Maternal Grandfather     History  Substance Use Topics  . Smoking status: Former Smoker -- 0.50 packs/day    Types: Cigarettes    Quit date: 02/03/2014  . Smokeless tobacco: Never Used  . Alcohol Use: No    Allergies:  Allergies  Allergen Reactions  . Peanuts [Peanut Oil] Swelling    Prescriptions prior to admission  Medication Sig Dispense Refill Last Dose  . Prenatal Vit-Fe Fumarate-FA (MULTIVITAMIN-PRENATAL) 27-0.8 MG TABS tablet Take 1 tablet by mouth daily at 12 noon.   12/05/2014 at Unknown time  . PROGESTERONE, VAGINAL, 8 % GEL Place 90 mg vaginally at bedtime. (Patient not taking: Reported on 11/12/2014) 1.125 g 11 Not Taking    Review of Systems  Constitutional: Negative for fever, chills and malaise/fatigue.  HENT: Negative for congestion.   Eyes: Negative for blurred vision and double vision.  Respiratory: Negative for cough and shortness of breath.   Cardiovascular: Negative for chest pain, palpitations, claudication and leg swelling.  Gastrointestinal: Positive for nausea and abdominal pain. Negative for  heartburn, vomiting, diarrhea and constipation.  Genitourinary: Negative for dysuria and hematuria.  Musculoskeletal: Negative for myalgias and back pain.  Skin: Negative for itching and rash.  Neurological: Negative for dizziness, loss of consciousness and headaches.   Physical Exam   Height 5\' 5"  (1.651 m), weight 283 lb 6.4 oz (128.549 kg), last menstrual period 02/19/2014.  Physical Exam  Constitutional: She is oriented to person, place, and time. She appears well-developed and well-nourished. No distress.  HENT:  Head: Normocephalic and atraumatic.  Eyes: Conjunctivae and EOM are normal.  Neck: Normal range of motion. No thyromegaly present.  Cardiovascular: Normal rate, regular rhythm and normal heart sounds.  Exam reveals no gallop and no friction rub.   No murmur heard. Respiratory: Breath sounds normal. No respiratory distress. She has no wheezes. She has no rales.  GI: Soft. Bowel sounds are normal. She exhibits no distension. There is no tenderness.  Musculoskeletal: Normal range of motion. She exhibits no edema.  Neurological: She is alert and oriented to person, place, and time.  Skin: Skin is warm and dry. No rash noted. No erythema.  Psychiatric: She has a normal mood and affect. Her behavior is normal.   Cervix: Dilation: 2 Effacement (%): 50, 60 Cervical Position: Posterior Exam by:: Dr Earlene PlaterWallace, MD   FHT: baseline 140 bpm,  good variability, no accels, no decels UC: none noted on toco MAU Course  Procedures  MDM FHT performed. Cervical exam performed.   Assessment and Plan  Patient is 29 y.o. G1P0 [redacted]w[redacted]d reporting abdominal pain likely secondary to [facility] contractions.  - fetal kick counts reinforced - preterm labor precautions -due to lack of cervical change and lack of UC on toco, labor is doubted -pt counseled about ctx and advised to return if she begins contracting regularly every 4-5 minutes  -stable for discharge to home   De Hollingshead 12/05/2014, 3:27 PM   I was consulted RE: exam and agree with above.  [place], [place] 12/05/2014 4:58 PM

## 2014-12-05 NOTE — Discharge Instructions (Signed)
Braxton Hicks Contractions °Contractions of the uterus can occur throughout pregnancy. Contractions are not always a sign that you are in labor.  °WHAT ARE BRAXTON HICKS CONTRACTIONS?  °Contractions that occur before labor are called Braxton Hicks contractions, or false labor. Toward the end of pregnancy (32-34 weeks), these contractions can develop more often and may become more forceful. This is not true labor because these contractions do not result in opening (dilatation) and thinning of the cervix. They are sometimes difficult to tell apart from true labor because these contractions can be forceful and people have different pain tolerances. You should not feel embarrassed if you go to the hospital with false labor. Sometimes, the only way to tell if you are in true labor is for your health care provider to look for changes in the cervix. °If there are no prenatal problems or other health problems associated with the pregnancy, it is completely safe to be sent home with false labor and await the onset of true labor. °HOW CAN YOU TELL THE DIFFERENCE BETWEEN TRUE AND FALSE LABOR? °False Labor °· The contractions of false labor are usually shorter and not as hard as those of true labor.   °· The contractions are usually irregular.   °· The contractions are often felt in the front of the lower abdomen and in the groin.   °· The contractions may go away when you walk around or change positions while lying down.   °· The contractions get weaker and are shorter lasting as time goes on.   °· The contractions do not usually become progressively stronger, regular, and closer together as with true labor.   °True Labor °· Contractions in true labor last 30-70 seconds, become very regular, usually become more intense, and increase in frequency.   °· The contractions do not go away with walking.   °· The discomfort is usually felt in the top of the uterus and spreads to the lower abdomen and low back.   °· True labor can be  determined by your health care provider with an exam. This will show that the cervix is dilating and getting thinner.   °WHAT TO REMEMBER °· Keep up with your usual exercises and follow other instructions given by your health care provider.   °· Take medicines as directed by your health care provider.   °· Keep your regular prenatal appointments.   °· Eat and drink lightly if you think you are going into labor.   °· If Braxton Hicks contractions are making you uncomfortable:   °¨ Change your position from lying down or resting to walking, or from walking to resting.   °¨ Sit and rest in a tub of warm water.   °¨ Drink 2-3 glasses of water. Dehydration may cause these contractions.   °¨ Do slow and deep breathing several times an hour.   °WHEN SHOULD I SEEK IMMEDIATE MEDICAL CARE? °Seek immediate medical care if: °· Your contractions become stronger, more regular, and closer together.   °· You have fluid leaking or gushing from your vagina.   °· You have a fever.   °· You pass blood-tinged mucus.   °· You have vaginal bleeding.   °· You have continuous abdominal pain.   °· You have low back pain that you never had before.   °· You feel your baby's head pushing down and causing pelvic pressure.   °· Your baby is not moving as much as it used to.   °Document Released: 05/17/2005 Document Revised: 05/22/2013 Document Reviewed: 02/26/2013 °Exit[employer]® Patient Information ©2015 [employer]. This information is not intended to replace advice given to you by your health care   provider. Make sure you discuss any questions you have with your health care provider. ° °

## 2014-12-05 NOTE — MAU Note (Signed)
Last "contraction" 4am this morning  Denies bright red vaginal bleeding.  States Positive bloody show.  Positivet fetal movement Denies SROM/LOF  Denies any infections/complications of pregnancy except: GBS positive per patient

## 2014-12-07 ENCOUNTER — Inpatient Hospital Stay (HOSPITAL_COMMUNITY): Payer: Medicaid Other | Admitting: Anesthesiology

## 2014-12-07 ENCOUNTER — Inpatient Hospital Stay (HOSPITAL_COMMUNITY)
Admission: AD | Admit: 2014-12-07 | Discharge: 2014-12-10 | DRG: 765 | Disposition: A | Discharge status: Home / Self Care | Payer: Medicaid Other | Source: Ambulatory Visit | Attending: Obstetrics and Gynecology | Admitting: Obstetrics and Gynecology

## 2014-12-07 ENCOUNTER — Encounter (HOSPITAL_COMMUNITY)
Admission: AD | Disposition: A | Discharge status: Home / Self Care | Payer: Self-pay | Source: Ambulatory Visit | Attending: Obstetrics and Gynecology

## 2014-12-07 ENCOUNTER — Encounter (HOSPITAL_COMMUNITY): Payer: Self-pay | Admitting: *Deleted

## 2014-12-07 ENCOUNTER — Inpatient Hospital Stay (HOSPITAL_COMMUNITY): Payer: Medicaid Other

## 2014-12-07 DIAGNOSIS — J45909 Unspecified asthma, uncomplicated: Secondary | ICD-10-CM | POA: Diagnosis present

## 2014-12-07 DIAGNOSIS — O9952 Diseases of the respiratory system complicating childbirth: Secondary | ICD-10-CM | POA: Diagnosis present

## 2014-12-07 DIAGNOSIS — Z3A40 40 weeks gestation of pregnancy: Secondary | ICD-10-CM | POA: Diagnosis present

## 2014-12-07 DIAGNOSIS — Z87442 Personal history of urinary calculi: Secondary | ICD-10-CM

## 2014-12-07 DIAGNOSIS — Z6841 Body Mass Index (BMI) 40.0 and over, adult: Secondary | ICD-10-CM

## 2014-12-07 DIAGNOSIS — Z833 Family history of diabetes mellitus: Secondary | ICD-10-CM

## 2014-12-07 DIAGNOSIS — O368390 Maternal care for abnormalities of the fetal heart rate or rhythm, unspecified trimester, not applicable or unspecified: Secondary | ICD-10-CM | POA: Diagnosis present

## 2014-12-07 DIAGNOSIS — O99214 Obesity complicating childbirth: Secondary | ICD-10-CM

## 2014-12-07 DIAGNOSIS — O99824 Streptococcus B carrier state complicating childbirth: Secondary | ICD-10-CM | POA: Diagnosis present

## 2014-12-07 DIAGNOSIS — O288 Other abnormal findings on antenatal screening of mother: Secondary | ICD-10-CM

## 2014-12-07 DIAGNOSIS — Z8249 Family history of ischemic heart disease and other diseases of the circulatory system: Secondary | ICD-10-CM | POA: Diagnosis not present

## 2014-12-07 DIAGNOSIS — Z87891 Personal history of nicotine dependence: Secondary | ICD-10-CM

## 2014-12-07 LAB — CBC
HCT: 38 % (ref 36.0–46.0)
Hemoglobin: 12.9 g/dL (ref 12.0–15.0)
MCH: 28.4 pg (ref 26.0–34.0)
MCHC: 33.9 g/dL (ref 30.0–36.0)
MCV: 83.5 fL (ref 78.0–100.0)
Platelets: 274 10*3/uL (ref 150–400)
RBC: 4.55 MIL/uL (ref 3.87–5.11)
RDW: 14.9 % (ref 11.5–15.5)
WBC: 23.4 10*3/uL — ABNORMAL HIGH (ref 4.0–10.5)

## 2014-12-07 LAB — TYPE AND SCREEN
ABO/RH(D): O POS
Antibody Screen: NEGATIVE

## 2014-12-07 LAB — ABO/RH: ABO/RH(D): O POS

## 2014-12-07 SURGERY — Surgical Case
Anesthesia: Epidural

## 2014-12-07 MED ORDER — PROMETHAZINE HCL 25 MG/ML IJ SOLN
6.2500 mg | INTRAMUSCULAR | Status: DC | PRN
Start: 2014-12-07 — End: 2014-12-07

## 2014-12-07 MED ORDER — OXYCODONE HCL 5 MG/5ML PO SOLN: 5.0000 mg | Freq: Once | ORAL | Status: DC | PRN

## 2014-12-07 MED ORDER — OXYTOCIN 10 UNIT/ML IJ SOLN
40.0000 [IU] | INTRAVENOUS | Status: DC | PRN
  Administered 2014-12-07: 40 [IU] via INTRAVENOUS

## 2014-12-07 MED ORDER — NALBUPHINE HCL 10 MG/ML IJ SOLN: 5.0000 mg | Freq: Once | INTRAMUSCULAR | Status: AC | PRN

## 2014-12-07 MED ORDER — NALOXONE HCL 0.4 MG/ML IJ SOLN: 0.4000 mg | INTRAMUSCULAR | Status: DC | PRN

## 2014-12-07 MED ORDER — SCOPOLAMINE 1 MG/3DAYS TD PT72
MEDICATED_PATCH | TRANSDERMAL | Status: DC | PRN
  Administered 2014-12-07: 1 via TRANSDERMAL

## 2014-12-07 MED ORDER — NALBUPHINE HCL 10 MG/ML IJ SOLN: 5.0000 mg | INTRAMUSCULAR | Status: DC | PRN

## 2014-12-07 MED ORDER — SODIUM BICARBONATE 8.4 % IV SOLN
INTRAVENOUS | Status: AC
  Filled 2014-12-07: qty 50

## 2014-12-07 MED ORDER — KETOROLAC TROMETHAMINE 30 MG/ML IJ SOLN: 30.0000 mg | Freq: Once | INTRAMUSCULAR | Status: DC | PRN

## 2014-12-07 MED ORDER — MEPERIDINE HCL 25 MG/ML IJ SOLN: 6.2500 mg | INTRAMUSCULAR | Status: DC | PRN

## 2014-12-07 MED ORDER — OXYCODONE-ACETAMINOPHEN 5-325 MG PO TABS: 1.0000 | ORAL_TABLET | ORAL | Status: DC | PRN

## 2014-12-07 MED ORDER — ONDANSETRON HCL 4 MG/2ML IJ SOLN
INTRAMUSCULAR | Status: AC
  Filled 2014-12-07: qty 2

## 2014-12-07 MED ORDER — SENNOSIDES-DOCUSATE SODIUM 8.6-50 MG PO TABS
2.0000 | ORAL_TABLET | ORAL | Status: DC
  Filled 2014-12-07 (×2): qty 2

## 2014-12-07 MED ORDER — SODIUM CHLORIDE 0.9 % IJ SOLN: 3.0000 mL | INTRAMUSCULAR | Status: DC | PRN

## 2014-12-07 MED ORDER — SIMETHICONE 80 MG PO CHEW: 80.0000 mg | CHEWABLE_TABLET | ORAL | Status: DC | PRN

## 2014-12-07 MED ORDER — TERBUTALINE SULFATE 1 MG/ML IJ SOLN: 0.2500 mg | Freq: Once | INTRAMUSCULAR | Status: DC | PRN

## 2014-12-07 MED ORDER — IBUPROFEN 600 MG PO TABS
600.0000 mg | ORAL_TABLET | Freq: Four times a day (QID) | ORAL | Status: DC
  Filled 2014-12-07: qty 1

## 2014-12-07 MED ORDER — DIBUCAINE 1 % RE OINT: 1.0000 "application " | TOPICAL_OINTMENT | RECTAL | Status: DC | PRN

## 2014-12-07 MED ORDER — TETANUS-DIPHTH-ACELL PERTUSSIS 5-2.5-18.5 LF-MCG/0.5 IM SUSP: 0.5000 mL | Freq: Once | INTRAMUSCULAR | Status: DC

## 2014-12-07 MED ORDER — WITCH HAZEL-GLYCERIN EX PADS: 1.0000 "application " | MEDICATED_PAD | CUTANEOUS | Status: DC | PRN

## 2014-12-07 MED ORDER — ACETAMINOPHEN 325 MG PO TABS: 650.0000 mg | ORAL_TABLET | ORAL | Status: DC | PRN

## 2014-12-07 MED ORDER — DIPHENHYDRAMINE HCL 50 MG/ML IJ SOLN: 12.5000 mg | INTRAMUSCULAR | Status: DC | PRN

## 2014-12-07 MED ORDER — DEXTROSE 5 % IV SOLN
3.0000 g | INTRAVENOUS | Status: AC
  Administered 2014-12-07: 3 g via INTRAVENOUS
  Filled 2014-12-07: qty 3000

## 2014-12-07 MED ORDER — SIMETHICONE 80 MG PO CHEW
80.0000 mg | CHEWABLE_TABLET | Freq: Three times a day (TID) | ORAL | Status: DC
  Administered 2014-12-08 – 2014-12-09 (×5): 80 mg via ORAL
  Filled 2014-12-07 (×5): qty 1

## 2014-12-07 MED ORDER — ONDANSETRON 8 MG PO TBDP
8.0000 mg | ORAL_TABLET | Freq: Once | ORAL | Status: AC
  Administered 2014-12-07: 8 mg via ORAL
  Filled 2014-12-07: qty 1

## 2014-12-07 MED ORDER — OXYCODONE-ACETAMINOPHEN 5-325 MG PO TABS: 2.0000 | ORAL_TABLET | ORAL | Status: DC | PRN

## 2014-12-07 MED ORDER — ONDANSETRON HCL 4 MG/2ML IJ SOLN
4.0000 mg | Freq: Four times a day (QID) | INTRAMUSCULAR | Status: DC | PRN
  Administered 2014-12-07: 4 mg via INTRAVENOUS

## 2014-12-07 MED ORDER — IBUPROFEN 600 MG PO TABS: 600.0000 mg | ORAL_TABLET | Freq: Four times a day (QID) | ORAL | Status: DC | PRN

## 2014-12-07 MED ORDER — MORPHINE SULFATE 0.5 MG/ML IJ SOLN
INTRAMUSCULAR | Status: AC
  Filled 2014-12-07: qty 10

## 2014-12-07 MED ORDER — PENICILLIN G POTASSIUM 5000000 UNITS IJ SOLR
5.0000 10*6.[IU] | Freq: Once | INTRAVENOUS | Status: AC
  Administered 2014-12-07: 5 10*6.[IU] via INTRAVENOUS
  Filled 2014-12-07: qty 5

## 2014-12-07 MED ORDER — MENTHOL 3 MG MT LOZG: 1.0000 | LOZENGE | OROMUCOSAL | Status: DC | PRN

## 2014-12-07 MED ORDER — OXYTOCIN 40 UNITS IN LACTATED RINGERS INFUSION - SIMPLE MED: 62.5000 mL/h | INTRAVENOUS | Status: AC

## 2014-12-07 MED ORDER — LIDOCAINE HCL (PF) 1 % IJ SOLN
30.0000 mL | INTRAMUSCULAR | Status: DC | PRN
  Filled 2014-12-07: qty 30

## 2014-12-07 MED ORDER — LIDOCAINE HCL (PF) 1 % IJ SOLN
INTRAMUSCULAR | Status: DC | PRN
  Administered 2014-12-07: 9 mL
  Administered 2014-12-07: 7 mL

## 2014-12-07 MED ORDER — PRENATAL MULTIVITAMIN CH: 1.0000 | ORAL_TABLET | Freq: Every day | ORAL | Status: DC

## 2014-12-07 MED ORDER — DIPHENHYDRAMINE HCL 25 MG PO CAPS: 25.0000 mg | ORAL_CAPSULE | ORAL | Status: DC | PRN

## 2014-12-07 MED ORDER — FENTANYL CITRATE (PF) 100 MCG/2ML IJ SOLN: 50.0000 ug | INTRAMUSCULAR | Status: DC | PRN

## 2014-12-07 MED ORDER — HYDROMORPHONE HCL 1 MG/ML IJ SOLN: 0.2500 mg | INTRAMUSCULAR | Status: DC | PRN

## 2014-12-07 MED ORDER — LACTATED RINGERS IV SOLN
INTRAVENOUS | Status: DC | PRN
  Administered 2014-12-07 (×2): via INTRAVENOUS

## 2014-12-07 MED ORDER — KETOROLAC TROMETHAMINE 30 MG/ML IJ SOLN
30.0000 mg | Freq: Four times a day (QID) | INTRAMUSCULAR | Status: AC | PRN
  Administered 2014-12-07: 30 mg via INTRAVENOUS

## 2014-12-07 MED ORDER — EPHEDRINE 5 MG/ML INJ
10.0000 mg | INTRAVENOUS | Status: DC | PRN
Start: 2014-12-07 — End: 2014-12-07

## 2014-12-07 MED ORDER — NALOXONE HCL 1 MG/ML IJ SOLN
1.0000 ug/kg/h | INTRAVENOUS | Status: DC | PRN
  Filled 2014-12-07: qty 2

## 2014-12-07 MED ORDER — OXYTOCIN BOLUS FROM INFUSION
500.0000 mL | INTRAVENOUS | Status: DC
Start: 2014-12-07 — End: 2014-12-07

## 2014-12-07 MED ORDER — PENICILLIN G POTASSIUM 5000000 UNITS IJ SOLR
2.5000 10*6.[IU] | INTRAVENOUS | Status: DC
  Filled 2014-12-07 (×3): qty 2.5

## 2014-12-07 MED ORDER — ONDANSETRON HCL 4 MG/2ML IJ SOLN: 4.0000 mg | Freq: Three times a day (TID) | INTRAMUSCULAR | Status: DC | PRN

## 2014-12-07 MED ORDER — LACTATED RINGERS IV SOLN
INTRAVENOUS | Status: DC
  Administered 2014-12-08: 06:00:00 via INTRAVENOUS

## 2014-12-07 MED ORDER — OXYCODONE HCL 5 MG PO TABS: 5.0000 mg | ORAL_TABLET | Freq: Once | ORAL | Status: DC | PRN

## 2014-12-07 MED ORDER — LACTATED RINGERS IV SOLN
INTRAVENOUS | Status: DC
  Administered 2014-12-07: 16:00:00 via INTRAVENOUS

## 2014-12-07 MED ORDER — FLEET ENEMA 7-19 GM/118ML RE ENEM: 1.0000 | ENEMA | RECTAL | Status: DC | PRN

## 2014-12-07 MED ORDER — PHENYLEPHRINE 40 MCG/ML (10ML) SYRINGE FOR IV PUSH (FOR BLOOD PRESSURE SUPPORT)
80.0000 ug | PREFILLED_SYRINGE | INTRAVENOUS | Status: DC | PRN
  Filled 2014-12-07: qty 20

## 2014-12-07 MED ORDER — SIMETHICONE 80 MG PO CHEW
80.0000 mg | CHEWABLE_TABLET | ORAL | Status: DC
  Administered 2014-12-07 – 2014-12-09 (×2): 80 mg via ORAL
  Filled 2014-12-07 (×2): qty 1

## 2014-12-07 MED ORDER — KETOROLAC TROMETHAMINE 30 MG/ML IJ SOLN: 30.0000 mg | Freq: Four times a day (QID) | INTRAMUSCULAR | Status: AC | PRN

## 2014-12-07 MED ORDER — SCOPOLAMINE 1 MG/3DAYS TD PT72
MEDICATED_PATCH | TRANSDERMAL | Status: AC
  Filled 2014-12-07: qty 1

## 2014-12-07 MED ORDER — LANOLIN HYDROUS EX OINT: 1.0000 "application " | TOPICAL_OINTMENT | CUTANEOUS | Status: DC | PRN

## 2014-12-07 MED ORDER — LACTATED RINGERS IV SOLN
INTRAVENOUS | Status: DC | PRN
  Administered 2014-12-07: 18:00:00 via INTRAVENOUS

## 2014-12-07 MED ORDER — VITAMIN K1 1 MG/0.5ML IJ SOLN
INTRAMUSCULAR | Status: AC
  Filled 2014-12-07: qty 0.5

## 2014-12-07 MED ORDER — OXYTOCIN 10 UNIT/ML IJ SOLN
INTRAMUSCULAR | Status: AC
  Filled 2014-12-07: qty 4

## 2014-12-07 MED ORDER — LACTATED RINGERS IV SOLN
INTRAVENOUS | Status: DC
  Administered 2014-12-07: 16:00:00 via INTRAUTERINE

## 2014-12-07 MED ORDER — LACTATED RINGERS IV SOLN
500.0000 mL | INTRAVENOUS | Status: DC | PRN
  Administered 2014-12-07: 1000 mL via INTRAVENOUS

## 2014-12-07 MED ORDER — LIDOCAINE-EPINEPHRINE (PF) 2 %-1:200000 IJ SOLN
INTRAMUSCULAR | Status: AC
  Filled 2014-12-07: qty 20

## 2014-12-07 MED ORDER — KETOROLAC TROMETHAMINE 30 MG/ML IJ SOLN
INTRAMUSCULAR | Status: AC
  Administered 2014-12-07: 30 mg via INTRAVENOUS
  Filled 2014-12-07: qty 1

## 2014-12-07 MED ORDER — SCOPOLAMINE 1 MG/3DAYS TD PT72
1.0000 | MEDICATED_PATCH | Freq: Once | TRANSDERMAL | Status: DC
  Filled 2014-12-07: qty 1

## 2014-12-07 MED ORDER — DIPHENHYDRAMINE HCL 25 MG PO CAPS: 25.0000 mg | ORAL_CAPSULE | Freq: Four times a day (QID) | ORAL | Status: DC | PRN

## 2014-12-07 MED ORDER — CITRIC ACID-SODIUM CITRATE 334-500 MG/5ML PO SOLN
30.0000 mL | ORAL | Status: DC | PRN
  Administered 2014-12-07: 30 mL via ORAL
  Filled 2014-12-07: qty 15

## 2014-12-07 MED ORDER — ERYTHROMYCIN 5 MG/GM OP OINT
TOPICAL_OINTMENT | OPHTHALMIC | Status: AC
  Filled 2014-12-07: qty 1

## 2014-12-07 MED ORDER — FENTANYL 2.5 MCG/ML BUPIVACAINE 1/10 % EPIDURAL INFUSION (WH - ANES)
14.0000 mL/h | INTRAMUSCULAR | Status: DC | PRN
  Administered 2014-12-07: 14 mL/h via EPIDURAL
  Filled 2014-12-07: qty 125

## 2014-12-07 MED ORDER — OXYTOCIN 40 UNITS IN LACTATED RINGERS INFUSION - SIMPLE MED: 62.5000 mL/h | INTRAVENOUS | Status: DC

## 2014-12-07 MED ORDER — MORPHINE SULFATE (PF) 0.5 MG/ML IJ SOLN
INTRAMUSCULAR | Status: DC | PRN
  Administered 2014-12-07: .5 mg via EPIDURAL
  Administered 2014-12-07: .5 mg via INTRAVENOUS
  Administered 2014-12-07: 4 mg via EPIDURAL

## 2014-12-07 MED ORDER — OXYTOCIN 40 UNITS IN LACTATED RINGERS INFUSION - SIMPLE MED
1.0000 m[IU]/min | INTRAVENOUS | Status: DC
  Filled 2014-12-07: qty 1000

## 2014-12-07 SURGICAL SUPPLY — 25 items
CLAMP CORD UMBIL (MISCELLANEOUS) IMPLANT
CONTAINER PREFILL 10% NBF 15ML (MISCELLANEOUS) IMPLANT
DRAPE SHEET LG 3/4 BI-LAMINATE (DRAPES) IMPLANT
DRSG OPSITE POSTOP 4X10 (GAUZE/BANDAGES/DRESSINGS) ×3 IMPLANT
DURAPREP 26ML APPLICATOR (WOUND CARE) ×3 IMPLANT
ELECT REM PT RETURN 9FT ADLT (ELECTROSURGICAL) ×3
ELECTRODE REM PT RTRN 9FT ADLT (ELECTROSURGICAL) ×1 IMPLANT
EXTRACTOR VACUUM M CUP 4 TUBE (SUCTIONS) IMPLANT
EXTRACTOR VACUUM M CUP 4' TUBE (SUCTIONS)
GLOVE BIOGEL PI IND STRL 6.5 (GLOVE) ×1 IMPLANT
GLOVE BIOGEL PI INDICATOR 6.5 (GLOVE) ×2
GLOVE SURG SS PI 6.0 STRL IVOR (GLOVE) ×3 IMPLANT
GOWN STRL REUS W/TWL LRG LVL3 (GOWN DISPOSABLE) ×6 IMPLANT
KIT ABG SYR 3ML LUER SLIP (SYRINGE) IMPLANT
NEEDLE HYPO 25X5/8 SAFETYGLIDE (NEEDLE) IMPLANT
NS IRRIG 1000ML POUR BTL (IV SOLUTION) ×3 IMPLANT
PACK C SECTION WH (CUSTOM PROCEDURE TRAY) ×3 IMPLANT
PAD OB MATERNITY 4.3X12.25 (PERSONAL CARE ITEMS) ×3 IMPLANT
RTRCTR C-SECT PINK 25CM LRG (MISCELLANEOUS) IMPLANT
SEPRAFILM MEMBRANE 5X6 (MISCELLANEOUS) IMPLANT
SUT PLAIN 0 NONE (SUTURE) IMPLANT
SUT VIC AB 0 CT1 36 (SUTURE) ×12 IMPLANT
SUT VIC AB 4-0 KS 27 (SUTURE) ×3 IMPLANT
TOWEL OR 17X24 6PK STRL BLUE (TOWEL DISPOSABLE) ×3 IMPLANT
TRAY FOLEY CATH SILVER 14FR (SET/KITS/TRAYS/PACK) ×3 IMPLANT

## 2014-12-07 NOTE — Anesthesia Postprocedure Evaluation (Signed)
Anesthesia Post Note  Patient: Jody Warren  Procedure(s) Performed: Procedure(s) (LRB): CESAREAN SECTION (N/A)  Anesthesia type: Epidural  Patient location: PACU  Post pain: Pain level controlled  Post assessment: Post-op Vital signs reviewed  Last Vitals:  Filed Vitals:   12/07/14 1900  BP:   Pulse:   Temp:   Resp: 22    Post vital signs: Reviewed  Level of consciousness: awake  Complications: No apparent anesthesia complications

## 2014-12-07 NOTE — Op Note (Signed)
Jody HugeEbony R Warren PROCEDURE DATE: * No surgery found *  PREOPERATIVE DIAGNOSIS: Intrauterine pregnancy at  2278w5d weeks gestation; non-reassuring fetal status  POSTOPERATIVE DIAGNOSIS: The same  PROCEDURE:     Cesarean Section  SURGEON:  Dr. Catalina AntiguaPeggy Kailei Cowens  ASSISTANT: none  INDICATIONS: Jody Warren is a 29 y.o. G1P0 at 478w5d scheduled for cesarean section secondary to non-reassuring fetal status.  The risks of cesarean section discussed with the patient included but were not limited to: bleeding which may require transfusion or reoperation; infection which may require antibiotics; injury to bowel, bladder, ureters or other surrounding organs; injury to the fetus; need for additional procedures including hysterectomy in the event of a life-threatening hemorrhage; placental abnormalities wth subsequent pregnancies, incisional problems, thromboembolic phenomenon and other postoperative/anesthesia complications. The patient concurred with the proposed plan, giving informed written consent for the procedure.    FINDINGS:  Viable female infant in cephalic presentation.  Apgars 8 and 8, weight, 7 pounds and 5 ounces.  Thick meconium stained amniotic fluid.  Intact placenta, three vessel cord.  Normal uterus, fallopian tubes and ovaries bilaterally.  ANESTHESIA:    Spinal INTRAVENOUS FLUIDS:2700 ml ESTIMATED BLOOD LOSS: 750 ml URINE OUTPUT:  250 ml SPECIMENS: Placenta sent to L&D COMPLICATIONS: None immediate  PROCEDURE IN DETAIL:  The patient received intravenous antibiotics and had sequential compression devices applied to her lower extremities while in the preoperative area.  She was then taken to the operating room where anesthesia was induced and was found to be adequate. A foley catheter was placed into her bladder and attached to Naje Rice gravity. She was then placed in a dorsal supine position with a leftward tilt, and prepped and draped in a sterile manner. After an adequate timeout was  performed, a Pfannenstiel skin incision was made with scalpel and carried through to the underlying layer of fascia. The fascia was incised in the midline and this incision was extended bilaterally using the Mayo scissors. Kocher clamps were applied to the superior aspect of the fascial incision and the underlying rectus muscles were dissected off bluntly. A similar process was carried out on the inferior aspect of the facial incision. The rectus muscles were separated in the midline bluntly and the peritoneum was entered bluntly. The Alexis self-retaining retractor was introduced into the abdominal cavity. Attention was turned to the lower uterine segment where a bladder flap was created, and a transverse hysterotomy was made with a scalpel and extended bilaterally bluntly. The infant was successfully delivered, and cord was clamped and cut and infant was handed over to awaiting neonatology team. Uterine massage was then administered and the placenta delivered intact with three-vessel cord. The uterus was cleared of clot and debris.  The hysterotomy was closed with 0 Vicryl in a running locked fashion, and an imbricating layer was also placed with a 0 Vicryl. Overall, excellent hemostasis was noted. The pelvis copiously irrigated and cleared of all clot and debris. Hemostasis was confirmed on all surfaces.  The peritoneum and the muscles were reapproximated using 0 vicryl interrupted stitches. The fascia was then closed using 0 Vicryl in a running fashion.  The subcutaneous layer was reapproximated with plain gut and the skin was closed in a subcuticular fashion using 3.0 Vicryl. The patient tolerated the procedure well. Sponge, lap, instrument and needle counts were correct x 2. She was taken to the recovery room in stable condition.    Lurene Robley,PEGGYMD  12/07/2014 6:14 PM

## 2014-12-07 NOTE — MAU Note (Signed)
Pt states here for contractions q10 minutes apart. Denies gush of fluid. Does have bloody show. Was 2cm in MAU last visit.

## 2014-12-07 NOTE — Transfer of Care (Signed)
Immediate Anesthesia Transfer of Care Note  Patient: Jody Warren  Procedure(s) Performed: Procedure(s): CESAREAN SECTION (N/A)  Patient Location: PACU  Anesthesia Type:Epidural  Level of Consciousness: awake, alert  and oriented  Airway & Oxygen Therapy: Patient Spontanous Breathing  Post-op Assessment: Report given to RN and Post -op Vital signs reviewed and stable  Post vital signs: Reviewed and stable  Last Vitals:  Filed Vitals:   12/07/14 1635  BP: 136/64  Pulse: 101  Temp:   Resp: 18    Complications: No apparent anesthesia complications

## 2014-12-07 NOTE — Anesthesia Preprocedure Evaluation (Addendum)
Anesthesia Evaluation  Patient identified by MRN, date of birth, ID band Patient awake    Reviewed: Allergy & Precautions, H&P , NPO status , Patient's Chart, lab work & pertinent test results  Airway Mallampati: III  TM Distance: >3 FB Neck ROM: full    Dental no notable dental hx.    Pulmonary former smoker,    Pulmonary exam normal       Cardiovascular negative cardio ROS Normal cardiovascular exam    Neuro/Psych negative psych ROS   GI/Hepatic negative GI ROS, Neg liver ROS,   Endo/Other  Morbid obesity  Renal/GU      Musculoskeletal   Abdominal (+) + obese,   Peds  Hematology negative hematology ROS (+)   Anesthesia Other Findings   Reproductive/Obstetrics (+) Pregnancy                            Anesthesia Physical Anesthesia Plan  ASA: III  Anesthesia Plan: Epidural   Post-op Pain Management:    Induction:   Airway Management Planned:   Additional Equipment:   Intra-op Plan:   Post-operative Plan:   Informed Consent: I have reviewed the patients History and Physical, chart, labs and discussed the procedure including the risks, benefits and alternatives for the proposed anesthesia with the patient or authorized representative who has indicated his/her understanding and acceptance.     Plan Discussed with: CRNA and Surgeon  Anesthesia Plan Comments: ([facility] within the last hour.)       Anesthesia Quick Evaluation

## 2014-12-07 NOTE — Progress Notes (Signed)
Jody Warren is a 29 y.o. G1P0 at 5229w5d by ultrasound admitted for non reassuring FHT ; BPP 2/8, Thick Meconium  Subjective: Pt is uncomfortable . Vag Exam reveals Spontaneous rupture of membranes with thick Meconium stained fluid. Pt is requesting epidural. Pt continues to vomit.Pt is being transferred to L/D for admission.   Objective: BP 139/80 mmHg  Pulse 106  Temp(Src) 98.7 F (37.1 C) (Oral)  Resp 18  Ht 5\' 5"  (1.651 m)  Wt 128.538 kg (283 lb 6 oz)  BMI 47.16 kg/m2  LMP 02/19/2014 (Approximate)      FHT:  FHR: 130 bpm, variability: minimal ,  accelerations:  Abscent,  decelerations:  Absent UC:   regular, every 2 minutes SVE:   Dilation: 2 Effacement (%): 70 Station: -3 Exam by:: Dellie BurnsScarlett Murray, RN BSN  Latest vag Exam in MAU- 4/80/-2 Labs: Lab Results  Component Value Date   WBC 12.0* 09/10/2014   HGB 12.2 09/10/2014   HCT 35.9 09/10/2014   MCV 84 09/10/2014   PLT 348 09/10/2014    Assessment / Plan: Spontaneous labor, progressing normally  Labor: Progressing normally Preeclampsia:   Fetal Wellbeing:  Bpp 2/8 Pain Control:  Epidural I/D:  GBS Positive Anticipated MOD:  NSVD  Glynna Failla Grissett 12/07/2014, 3:03 PM

## 2014-12-07 NOTE — Progress Notes (Signed)
Jody Warren is a 29 y.o. G1P0 at 6783w5d  admitted for induction of labor due to non reassurring fht's.  Subjective:  Pt is to get epidural and then prepare for Primary C/S. Dr Jolayne Pantheronstant aware of FHT's Cat 3 with repetitive late decels. Pt is agreeable with POC. Amnioinfusion continues to thin meconium stained fluid. First dose of PCN for GBS infusing. Objective: BP 143/86 mmHg  Pulse 100  Temp(Src) 98.4 F (36.9 C) (Oral)  Resp 18  Ht 5\' 5"  (1.651 m)  Wt 128.368 kg (283 lb)  BMI 47.09 kg/m2  SpO2 100%  LMP 02/19/2014 (Approximate)      SVE:   Dilation: 4.5 Effacement (%): 80 Station: -2 Exam by:: Jody BolusLori Warren, CNM  Labs: Lab Results  Component Value Date   WBC 23.4* 12/07/2014   HGB 12.9 12/07/2014   HCT 38.0 12/07/2014   MCV 83.5 12/07/2014   PLT 274 12/07/2014    Assessment / Plan: Pt will not be induced but will have c/s due to fetal heart tones  Labor: Progressing normally Preeclampsia:   Fetal Wellbeing:  Category III Pain Control:  Epidural I/D:  gbs positive Anticipated MOD:  PLTCS  Warren,Jody Warren 12/07/2014, 4:22 PM

## 2014-12-07 NOTE — Progress Notes (Signed)
Patient [facility] Patient noted to have repetitive late decelerations on fetal monitorting despite IV fluid bolus and extrauterine resuscitations. Patient was counseled on delivery via primary cesarean section secondary to NRFHT remote from delivery. Risks, benefits and alternatives were explained including but not limited to risks of bleeding, infection and damage to adjacent organs. Patient verbalized understanding and all questions were answered. Consent signed

## 2014-12-07 NOTE — Anesthesia Procedure Notes (Signed)
Epidural Patient location during procedure: OB Start time: 12/07/2014 4:19 PM End time: 12/07/2014 4:25 PM  Staffing Anesthesiologist: Leilani AbleHATCHETT, Winnie Umali Performed by: anesthesiologist   Preanesthetic Checklist Completed: patient identified, surgical consent, pre-op evaluation, timeout performed, IV checked, risks and benefits discussed and monitors and equipment checked  Epidural Patient position: sitting Prep: site prepped and draped and DuraPrep Patient monitoring: continuous pulse ox and blood pressure Approach: midline Location: L3-L4 Injection technique: LOR air  Needle:  Needle type: Tuohy  Needle gauge: 17 G Needle length: 9 cm and 9 Needle insertion depth: 8 cm Catheter type: closed end flexible Catheter size: 19 Gauge Catheter at skin depth: 14 cm Test dose: negative and Other  Assessment Sensory level: T9 Events: blood not aspirated, injection not painful, no injection resistance, negative IV test and no paresthesia  Additional Notes Reason for block:procedure for pain

## 2014-12-07 NOTE — MAU Provider Note (Signed)
History     CSN: [id number]  Arrival date and time: 12/07/14 0951   None     Chief Complaint  Patient presents with  . Contractions   EXB:[id number]:Jody Warren is a 29 year old G1P0, 6211w3d with complaints of abdominal pain due to contractions that she started having this morning. The contractions have been going for 2 hrs with a frequency of 10 minutes between episodes, prior to coming to the hospital This is the first time this has occurred during her pregnancy. She does endorses vaginal bleeding and discharge that started after her contractions. She endorses nausea, vomiting and had an episode of vomiting on her way to the hospital. Denies hemoptysis, chest pain, dizziness, SOB, difficulty breathing, epigastric or abdominal pain,      Past Medical History  Diagnosis Date  . Asthma since birth   . Obesity   . Kidney stone     Past Surgical History  Procedure Laterality Date  . No past surgeries      Family History  Problem Relation Age of Onset  . Hyperlipidemia Mother   . Hypertension Father   . Diabetes Father   . Cancer Maternal Grandfather     History  Substance Use Topics  . Smoking status: Former Smoker -- 0.50 packs/day    Types: Cigarettes    Quit date: 02/03/2014  . Smokeless tobacco: Never Used  . Alcohol Use: No    Allergies:  Allergies  Allergen Reactions  . Peanuts [Peanut Oil] Swelling    Prescriptions prior to admission  Medication Sig Dispense Refill Last Dose  . Prenatal Vit-Fe Fumarate-FA (MULTIVITAMIN-PRENATAL) 27-0.8 MG TABS tablet Take 2 tablets by mouth daily at 12 noon.    12/07/2014 at Unknown time  . PROGESTERONE, VAGINAL, 8 % GEL Place 90 mg vaginally at bedtime. (Patient not taking: Reported on 11/12/2014) 1.125 g 11 Completed Course at Unknown time    Review of Systems  Constitutional: Negative for fever and chills.  Respiratory: Negative for cough, hemoptysis and shortness of breath.   Cardiovascular: Negative for chest pain and  palpitations.  Gastrointestinal: Positive for nausea and vomiting. Negative for heartburn, abdominal pain, diarrhea and constipation.  Skin: Negative for itching.  Neurological: Negative for dizziness and headaches.  :  Physical Exam   Blood pressure 139/80, pulse 106, temperature 98.7 F (37.1 C), temperature source Oral, resp. rate 18, height 5\' 5"  (1.651 m), weight 283 lb 6 oz (128.538 kg), last menstrual period 02/19/2014.  Physical Exam  Constitutional: She is oriented to person, place, and time.  Mild distress  HENT:  Head: Normocephalic and atraumatic.  Eyes: Conjunctivae and EOM are normal.  Cardiovascular: Normal rate, regular rhythm and normal heart sounds.  Exam reveals no gallop and no friction rub.   No murmur heard. Respiratory: Effort normal and breath sounds normal. No respiratory distress. She has no wheezes. She has no rales.  GI: Soft. There is no tenderness.  Neurological: She is alert and oriented to person, place, and time.  Psychiatric: She has a normal mood and affect. Her behavior is normal. Judgment and thought content normal.  :   MAU Course  Procedures  MDM Zolfran adminstered to help with nausea  Assessment and Plan  Jody Warren, 29 year old G1P0, 6711w3d with abdominal pain likely due to her contractions that have been occuring since this morning.  There has been some associated nausea and vomiting after her contractions started, as well as some vaginal bleeding and discharge.  1) Nausea=  Zolfran has been adminstered, continue to monitor 2) Vomiting= Continue to monitor, depending on how much fluid loss, IV fluids may be indicated 3) Abdominal pain= Scheduled for biophysical profile via fetal U/S evaluation today.  Jody Blow Jr. 12/07/2014, 11:38 AM

## 2014-12-07 NOTE — H&P (Signed)
Jody Warren is a 29 y.o. female presenting from MAU for MIOL -non reassurring fetal heart tones, Reports good fetal movement until this morning. SROM in MAU approx 230pm- thick meconium fluid. Uncomfortable with contractions.  Maternal Medical History:  Reason for admission: Rupture of membranes and nausea.  Non Reassurring Fetal well Being. BPP 2/8. SROM- Thick Mec approx 230pm  Contractions: Onset was 3-5 hours ago.   Frequency: irregular.   Perceived severity is moderate.    Fetal activity: Perceived fetal activity is normal.    Prenatal complications: Bpp 2/10 WBC 23.4  Prenatal Complications - Diabetes: none.       [facility]  [facility] [place] [facility]  Dating By 6 week US (irregular periods)  Pap 04/18/2014 neg  GC/CT Initial: -/- 36+wks:  Genetic Screen NT/IT: DSR, ONTD, T-18 risk all less than 1:5000  CF screen neg  Anatomic US   Flu vaccine 05/27/2014   Tdap Recommended ~ 28wks  Glucose Screen  1 Hour: 108(pt ate by mistake)  GBS  positive  Feed Preference   Contraception   Circumcision   Childbirth Classes   Pediatrician        OB History    Gravida Para Term Preterm AB TAB SAB Ectopic Multiple Living   1              Past Medical History  Diagnosis Date  . Asthma since birth   . Obesity   . Kidney stone    Past Surgical History  Procedure Laterality Date  . No past surgeries     Family History: family history includes Cancer in her maternal grandfather; Diabetes in her father; Hyperlipidemia in her mother; Hypertension in her father. Social History:  reports that she quit smoking about 10 months ago. Her smoking use included Cigarettes. She smoked 0.50 packs per day. She has never used smokeless tobacco. She reports that she does not drink alcohol or use illicit drugs.   Prenatal Transfer Tool  Maternal Diabetes: No Genetic Screening: Normal Maternal Ultrasounds/Referrals: Normal Fetal Ultrasounds or other  Referrals:  Other: Bpp 2/8 Maternal Substance Abuse:  No Significant Maternal Medications:  None Significant Maternal Lab Results:  None Other Comments:  None  Review of Systems  Constitutional: Negative for fever.  Gastrointestinal: Positive for nausea, vomiting and abdominal pain.  All other systems reviewed and are negative.   Dilation: 2 Effacement (%): 70 Station: -3 Exam by:: Dellie BurnsScarlett Murray, RN BSN Blood pressure 139/80, pulse 106, temperature 98.7 F (37.1 C), temperature source Oral, resp. rate 18, height 5\' 5"  (1.651 m), weight 128.538 kg (283 lb 6 oz), last menstrual period 02/19/2014.    Maternal Exam:  Uterine Assessment: Contraction strength is moderate.  Contraction frequency is irregular.   Abdomen: Patient reports no abdominal tenderness. Fetal presentation: vertex  Introitus: Normal vulva. Normal vagina.  Amniotic fluid character: meconium stained.  Pelvis: adequate for delivery.   Cervix: Cervix evaluated by digital exam.     Fetal Exam Fetal Monitor Review: Mode: ultrasound.   Variability: minimal (<5 bpm).   Pattern: no accelerations.    Fetal State Assessment: Category III - tracings are abnormal.    Dilation: 2 Effacement (%): 70 Cervical Position: Middle Station: -3 Presentation: Vertex Exam by:: Dellie BurnsScarlett Murray, RN BSN Results for orders placed or performed during the hospital encounter of 12/07/14 (from the past 24 hour(s))  Type and screen     Status: None (Preliminary result)   Collection Time: 12/07/14  2:40 PM  Result Value  Ref Range   ABO/RH(D) O POS    Antibody Screen PENDING    Sample Expiration 12/10/2014   CBC     Status: Abnormal   Collection Time: 12/07/14  2:53 PM  Result Value Ref Range   WBC 23.4 (H) 4.0 - 10.5 K/uL   RBC 4.55 3.87 - 5.11 MIL/uL   Hemoglobin 12.9 12.0 - 15.0 g/dL   HCT 16.1 09.6 - 04.5 %   MCV 83.5 78.0 - 100.0 fL   MCH 28.4 26.0 - 34.0 pg   MCHC 33.9 30.0 - 36.0 g/dL   RDW 40.9 81.1 - 91.4 %    Platelets 274 150 - 400 K/uL   Physical Exam  Nursing note and vitals reviewed. Constitutional: She is oriented to person, place, and time. She appears well-developed and well-nourished. No distress.  HENT:  Head: Normocephalic.  Neck: Normal range of motion.  Cardiovascular: Normal rate.   Respiratory: Effort normal. No respiratory distress.  GI: Soft. There is no tenderness.  Genitourinary: Vagina normal.  Musculoskeletal: Normal range of motion. She exhibits no edema.  Neurological: She is alert and oriented to person, place, and time.  Skin: Skin is warm and dry. No rash noted. No erythema. No pallor.  Psychiatric: She has a normal mood and affect. Her behavior is normal. Judgment and thought content normal.    Prenatal labs: ABO, Rh: O/POS/-- (11/19 7829) Antibody: Negative (04/12 1024) Rubella: 3.33 (11/19 0949) RPR: Non Reactive (04/12 1024)  HBsAg: NEGATIVE (11/19 0949)  HIV: NONREACTIVE (11/19 0949)  GBS: Positive (06/14 0000)   Assessment/Plan: IUP @ [redacted]w[redacted]d being induced for MIOL non reassurring fetal heart tones GBS positive Meconium stained fluid Bpp 2/8  PCN ABX Prophylaxis IUPC Amnioinfusion Epidural placed Admit to L/D      Clemmons,Lori Grissett 12/07/2014, 2:28 PM

## 2014-12-08 LAB — CBC
HEMATOCRIT: 31.4 % — AB (ref 36.0–46.0)
HEMOGLOBIN: 10.6 g/dL — AB (ref 12.0–15.0)
MCH: 28 pg (ref 26.0–34.0)
MCHC: 33.8 g/dL (ref 30.0–36.0)
MCV: 82.8 fL (ref 78.0–100.0)
Platelets: 245 10*3/uL (ref 150–400)
RBC: 3.79 MIL/uL — ABNORMAL LOW (ref 3.87–5.11)
RDW: 15 % (ref 11.5–15.5)
WBC: 24.8 10*3/uL — AB (ref 4.0–10.5)

## 2014-12-08 LAB — HIV ANTIBODY (ROUTINE TESTING W REFLEX): HIV Screen 4th Generation wRfx: NONREACTIVE

## 2014-12-08 LAB — RPR: RPR Ser Ql: NONREACTIVE

## 2014-12-08 MED ORDER — COMPLETENATE 29-1 MG PO CHEW
1.0000 | CHEWABLE_TABLET | Freq: Every day | ORAL | Status: DC
  Administered 2014-12-08 – 2014-12-09 (×2): 1 via ORAL
  Filled 2014-12-08 (×4): qty 1

## 2014-12-08 MED ORDER — IBUPROFEN 100 MG/5ML PO SUSP
600.0000 mg | Freq: Four times a day (QID) | ORAL | Status: DC | PRN
  Administered 2014-12-08 – 2014-12-10 (×8): 600 mg via ORAL
  Filled 2014-12-08 (×10): qty 30

## 2014-12-08 MED ORDER — OXYCODONE HCL 5 MG/5ML PO SOLN
5.0000 mg | ORAL | Status: DC | PRN
Start: 2014-12-08 — End: 2014-12-10
  Administered 2014-12-08 – 2014-12-10 (×6): 5 mg via ORAL
  Filled 2014-12-08 (×6): qty 5

## 2014-12-08 NOTE — Progress Notes (Signed)
Post Partum Day 1 Subjective:  Jody Warren is a 29 y.o. G1P1001 569w5d s/p urgent c/s.  No acute events overnight.  Pt denies problems with ambulating, voiding or po intake.  She reports nausea, but denies vomiting, had some italian ice last night with subsequent nausea.  Pain is well controlled.  ochia Minimal.  Plan for birth control is undecided.  Method of Feeding: breast  Objective: Blood pressure 118/51, pulse 77, temperature 98.7 F (37.1 C), temperature source Oral, resp. rate 18, height 5\' 5"  (1.651 m), weight 283 lb (128.368 kg), last menstrual period 02/19/2014, SpO2 99 %, unknown if currently breastfeeding.  Physical Exam:  General: alert, cooperative and no distress Lochia:normal flow Chest: CTAB Heart: RRR no m/r/g Abdomen: +BS, soft, nontender,  Uterine Fundus: firm DVT Evaluation: No evidence of DVT seen on physical exam. Extremities: no edema   Recent Labs  12/07/14 1453 12/08/14 0559  HGB 12.9 10.6*  HCT 38.0 31.4*    Assessment/Plan:  ASSESSMENT: Jody Hugebony R Hupfer is a 29 y.o. G1P1001 769w5d s/p urgent primary c/s 2/2 fetal distress  Plan for discharge tomorrow     LOS: 1 day   Vikrant Pryce ROCIO 12/08/2014, 9:02 AM

## 2014-12-08 NOTE — Anesthesia Postprocedure Evaluation (Signed)
Anesthesia Post Note  Patient: Jody Warren  Procedure(s) Performed: Procedure(s) (LRB): CESAREAN SECTION (N/A)  Anesthesia type: Epidural  Patient location: Mother/Baby  Post pain: Pain level controlled  Post assessment: Post-op Vital signs reviewed  Last Vitals:  Filed Vitals:   12/08/14 0632  BP: 118/51  Pulse: 77  Temp: 37.1 C  Resp: 18    Post vital signs: Reviewed  Level of consciousness:alert  Complications: No apparent anesthesia complications

## 2014-12-08 NOTE — Addendum Note (Signed)
Addendum  created 12/08/14 1018 by Lincoln BrighamAngela Draughon Clydell Sposito, CRNA   Modules edited: Notes Section   Notes Section:  File: [id number]

## 2014-12-08 NOTE — Lactation Note (Signed)
This note was copied from the chart of Jody Warren. Lactation Consultation Note  Patient Name: Jody Golden Hurterbony Deman HYQMV'HToday's Date: 12/08/2014 Reason for consult: Initial assessment   Initial consult at 21 hours old; GA 40.5; Bw 7 lbs, 5.5 oz.  C-Section for decels, thick MSF.  Mom is a P1 with large pendulous breast and extremely flat nipples that do not erect with stimulation. Infant has breastfed x8 (10-60 min) + attempts (0-5 min) + formula x5 (7-10 ml) since birth;  voids-2; stools-2 since birth. Mom asked RN to set her up with a DEBP since she mostly wanted to pump and bottle d/t flat nipples.   LC came in as mom was about to pump with DEBP for first time.  LC showed mom how to use preemie setting turning pump up to 3-4 teardrops.  Demonstrated hands-on pumping with hand expression at end of pumping session.  Hand expression taught with return demonstration and observation of colostrum mom was able to easily hand express.  Mom pumped for the 15 minutes on preemie setting. Infant was showing cues, so mom asked if LC could help with latching.  Mom was using #24 nipple shield given by night RN, but #24 was too big.  LC decreased size to #20 nipple shield for a better fit, but mom might could even go smaller with a #16 nipple shield for future feedings.   Reviewed with mom sandwiching of breast and latching with asymmetrical latching technique for depth.  Attempted to latch infant, but she only took a few sucks as she was busy having a BM.   Encouraged mom to breastfeed or pump at least 8 times per day to maintain milk supply. Lactation brochure given and informed of hospital support group and outpatient services.  Encouraged to call for assistance as needed.      Maternal Data Has patient been taught Hand Expression?: Yes Does the patient have breastfeeding experience prior to this delivery?: No  Feeding Feeding Type: Breast Milk with Formula added Length of feed: 0 min  (attempt)  LATCH Score/Interventions       Type of Nipple: Flat  Comfort (Breast/Nipple): Soft / non-tender     Hold (Positioning): Assistance needed to correctly position infant at breast and maintain latch. Intervention(s): Breastfeeding basics reviewed;Support Pillows;Skin to skin     Lactation Tools Discussed/Used Tools: Nipple Shields Nipple shield size: 20 (decreased size to #20) WIC Program: Yes Pump Review: Setup, frequency, and cleaning;Milk Storage Initiated by:: RN Date initiated:: 12/08/14   Consult Status Consult Status: Follow-up Date: 12/09/14 Follow-up type: In-patient    Lendon KaVann, Laszlo Ellerby Walker 12/08/2014, 3:35 PM

## 2014-12-09 ENCOUNTER — Encounter (HOSPITAL_COMMUNITY): Payer: Self-pay | Admitting: Obstetrics and Gynecology

## 2014-12-09 ENCOUNTER — Inpatient Hospital Stay (HOSPITAL_COMMUNITY): Admission: RE | Admit: 2014-12-09 | Payer: Medicaid Other | Source: Ambulatory Visit

## 2014-12-09 MED ORDER — OXYCODONE HCL 5 MG/5ML PO SOLN: 5.0000 mg | ORAL | Status: DC | PRN

## 2014-12-09 NOTE — Discharge Summary (Signed)
Subjective: Postpartum Day 2: Cesarean Delivery Patient reports tolerating PO, + flatus and no problems voiding.    Objective: Vital signs in last 24 hours: Temp:  [98.9 F (37.2 C)-99 F (37.2 C)] 98.9 F (37.2 C) (07/10 1713) Pulse Rate:  [82-96] 82 (07/10 1713) Resp:  [18] 18 (07/10 1713) BP: (127-128)/(67-72) 127/67 mmHg (07/10 1713) SpO2:  [100 %] 100 % (07/10 1713)  Physical Exam:  General: alert and no distress Lochia: appropriate Uterine Fundus: firm Incision: no significant drainage, no dehiscence, no significant erythema DVT Evaluation: No evidence of DVT seen on physical exam. Negative Homan's sign.   Recent Labs  12/07/14 1453 12/08/14 0559  HGB 12.9 10.6*  HCT 38.0 31.4*    Assessment/Plan: Status post Cesarean section. Doing well postoperatively.  Discharge to home tomorrow, staying one extra day because of her child.  Tereso NewcomerANYANWU,Elyza Whitt A, MD 12/09/2014, 1:37 PM

## 2014-12-09 NOTE — Discharge Instructions (Signed)

## 2014-12-09 NOTE — Lactation Note (Signed)
This note was copied from the chart of Jody Golden Hurterbony Shanahan. Lactation Consultation Note  Patient Name: Jody Warren UEAVW'UToday's Date: 12/09/2014 Reason for consult: Follow-up assessment;Difficult latch Mom reports she attempts BF before giving any bottles but baby is not sustaining the latch. She has DEBP set up but has not pumped since yesterday. LC stressed importance of consistent pumping to encourage milk production, prevent engorgement and protect milk supply. LC offered to assist Mom with latching baby, demonstrate SNS or pre-filling nipple shield to help baby sustain latch. LC left phone number for Mom to call with next feeding. [facility] [place] she will need pump for home use tomorrow due to difficult latch. Mom has increased supplements to 15 ml with feedings per her report.   Maternal Data    Feeding Feeding Type: Bottle Fed - Formula  LATCH Score/Interventions                      Lactation Tools Discussed/Used Tools: Nipple Dorris CarnesShields;Pump Nipple shield size: 20;24 Breast pump type: Double-Electric Breast Pump   Consult Status Consult Status: Follow-up Date: 12/09/14 Follow-up type: In-patient    Jody Warren, Jody Warren 12/09/2014, 12:29 PM

## 2014-12-10 LAB — CBC
HEMATOCRIT: 30.4 % — AB (ref 36.0–46.0)
HEMOGLOBIN: 10.1 g/dL — AB (ref 12.0–15.0)
MCH: 27.9 pg (ref 26.0–34.0)
MCHC: 33.2 g/dL (ref 30.0–36.0)
MCV: 84 fL (ref 78.0–100.0)
Platelets: 262 10*3/uL (ref 150–400)
RBC: 3.62 MIL/uL — ABNORMAL LOW (ref 3.87–5.11)
RDW: 15 % (ref 11.5–15.5)
WBC: 15.9 10*3/uL — ABNORMAL HIGH (ref 4.0–10.5)

## 2014-12-10 NOTE — Discharge Summary (Signed)
Obstetric Discharge Summary Reason for Admission: cesarean section Prenatal Procedures: NST and ultrasound Intrapartum Procedures: cesarean: low cervical, transverse Postpartum Procedures: none Complications-Operative and Postpartum: none  Hospital Course:  Active Problems:   Fetal heart rate non-reassuring affecting management of mother   Jody Warren is a 29 y.o. G1P1001 s/p pLTCS 2/2 non-reassuring FHT's.  Patient was admitted 07/09.  She has postpartum course that was uncomplicated including no problems with ambulating, PO intake, urination, pain, or bleeding. The pt feels ready to go home and  will be discharged with outpatient follow-up.   Today: No acute events overnight.  Pt denies problems with ambulating, voiding or po intake.  She denies nausea or vomiting.  Pain is well controlled.  She has had flatus. She has not had bowel movement.  Lochia Small.  Plan for birth control is  considering NuvaRing and Nexplanon.  Method of Feeding: Breast & Bottle  Physical Exam:  General: alert and cooperative Lochia: appropriate Uterine Fundus: firm Incision: healing well, no significant drainage, no significant erythema DVT Evaluation: No evidence of DVT seen on physical exam.  H/H: Lab Results  Component Value Date/Time   HGB 10.6* 12/08/2014 05:59 AM   HCT 31.4* 12/08/2014 05:59 AM    Discharge Diagnoses: Term Pregnancy-delivered  Discharge Information: Date: 12/10/2014 Activity: pelvic rest Diet: routine  Medications: PNV, Ibuprofen, Colace and Percocet Breast feeding:  Yes Condition: stable Instructions: refer to handout Discharge to: home      Medication List    STOP taking these medications        PROGESTERONE (VAGINAL) 8 % Gel      TAKE these medications        multivitamin-prenatal 27-0.8 MG Tabs tablet  Take 2 tablets by mouth daily at 12 noon.     oxyCODONE 5 MG/5ML solution  Commonly known as:  ROXICODONE  Take 5 mLs (5 mg total) by mouth  every 4 (four) hours as needed for severe pain.           Follow-up Information    Follow up with [facility]. Schedule an appointment as soon as possible for a visit in 4 weeks.   Why:  For post-partum visit   Contact information:   [address] [place] [phone] [id number]      De Hollingsheadatherine L Wallace ,MD 12/10/2014,7:50 AM    OB fellow attestation I have seen and examined this patient and agree with above documentation in the resident's note.   Jody Warren is a 29 y.o. G1P1001 s/p pLTCS for NRFHT.   Pain is well controlled.  Plan for birth control is NuvaRing vaginal inserts, vs Nexplanon.  Method of Feeding: br  PE:  BP 138/76 mmHg  Pulse 94  Temp(Src) 96 F (35.6 C) (Oral)  Resp 18  Ht 5\' 5"  (1.651 m)  Wt 283 lb (128.368 kg)  BMI 47.09 kg/m2  SpO2 100%  LMP 02/19/2014 (Approximate)  Breastfeeding? Unknown Fundus firm  Recent Labs  12/08/14 0559 12/10/14 0801  HGB 10.6* 10.1*  HCT 31.4* 30.4*   Plan: discharge today - postpartum care discussed - f/u clinic in 6 weeks for postpartum visit  Federico FlakeKimberly Niles Labrina Lines, MD  6:20 PM

## 2014-12-10 NOTE — Progress Notes (Signed)
Patient education complete and follow up appointment in place patient verbalized understanding. Ready for Discharge  12/10/14 at 1135.

## 2014-12-10 NOTE — Lactation Note (Signed)
This note was copied from the chart of Jody Golden Hurterbony Fluegge. Lactation Consultation Note:  Mother states that infant latched and fed for 15 mins. Assist mother with using tea-cup hold on flat nipple to latch infant. Infant sustained latch for 10 mins. With observed swallows. Also observed sprays of milk when hand express.mother breast are soft but starting to fill. Mother assist with post pumping. She pumped 7 ml and bottle fed. Reviewed use of nipple shield. Attempt to use all 3 sizes . Unable to get nipple shield properly placed for feeding. Mother has very soft flat nipple tissue. She is able to compress and get infant latched. Advised mother of needed to regularly pumping every 2-3 hours. Mother plans to continue to breastfeed, supplement and pump. Mother states she has an appt with [place] [facility]IC at 2:30 today. She plans to get an electric pump. [facility]. Mother advised in treatment to prevent engorgement . Mother is aware of available LC services. Advised in outpatient follow up for continued assistance. Mother states she has good support. She will call as needed.   Patient Name: Jody Golden Hurterbony Otter ZOXWR'UToday's Date: 12/10/2014 Reason for consult: Follow-up assessment   Maternal Data    Feeding Feeding Type: Breast Milk Length of feed: 10 min  LATCH Score/Interventions Latch: Grasps breast easily, tongue down, lips flanged, rhythmical sucking. Intervention(s): Adjust position;Assist with latch;Breast compression  Audible Swallowing: Spontaneous and intermittent Intervention(s): Skin to skin;Hand expression Intervention(s): Hand expression  Type of Nipple: Flat Intervention(s): Hand pump;Double electric pump  Comfort (Breast/Nipple): Soft / non-tender     Hold (Positioning): Assistance needed to correctly position infant at breast and maintain latch. Intervention(s): Support Pillows;Position options  LATCH Score: 8  Lactation Tools  Discussed/Used WIC Program: Yes   Consult Status      Jody Warren, Jody Warren 12/10/2014, 11:20 AM

## 2014-12-11 ENCOUNTER — Telehealth: Payer: Self-pay | Admitting: Obstetrics & Gynecology

## 2014-12-11 ENCOUNTER — Telehealth: Payer: Self-pay | Admitting: *Deleted

## 2014-12-11 MED ORDER — OXYCODONE HCL 5 MG/5ML PO SOLN: 5.0000 mg | ORAL | Status: DC | PRN

## 2014-12-11 NOTE — Telephone Encounter (Signed)
Pt requesting pain medication, had a c-section on 12/07/2014.   Pt states was given oxycodone 5mg /445ml solution on 12/09/2014 did not have much left.

## 2014-12-13 ENCOUNTER — Telehealth: Payer: Self-pay | Admitting: *Deleted

## 2014-12-13 NOTE — Telephone Encounter (Signed)
Pt returning phone call from our office. Pt had a c-section on 12/07/2014, pt informed will need to come in Monday, December 16, 2014 for incision check. Pt has no c/o fever, redness, heat or drainage at incision site. Call transferred to front staff for an appt to be scheduled for Monday.

## 2014-12-16 ENCOUNTER — Ambulatory Visit (INDEPENDENT_AMBULATORY_CARE_PROVIDER_SITE_OTHER): Payer: Medicaid Other | Admitting: Obstetrics & Gynecology

## 2014-12-16 ENCOUNTER — Encounter: Payer: Self-pay | Admitting: Obstetrics & Gynecology

## 2014-12-16 VITALS — BP 110/60 | HR 76 | Wt 280.4 lb

## 2014-12-16 DIAGNOSIS — Z9889 Other specified postprocedural states: Secondary | ICD-10-CM

## 2014-12-16 DIAGNOSIS — B372 Candidiasis of skin and nail: Secondary | ICD-10-CM | POA: Diagnosis not present

## 2014-12-16 NOTE — Progress Notes (Signed)
Patient ID: Jody Warren, female   DOB: 04/01/1986, 29 y.o.   MRN: [id number] Chief Complaint  Patient presents with  . Routine Post Op    check incision.    Blood pressure 110/60, pulse 76, weight 280 lb 6.4 oz (127.189 kg), unknown if currently breastfeeding.  Here for incision check  Primary Caesarean section 9 days ago No complaints, doing well Some swelling  Incision clean dry intact Gentian violet placed  Follow up 4 weeks for pp exam

## 2014-12-24 NOTE — MAU Provider Note (Signed)
CSN: [id number]  Arrival date and time: 12/07/14 0951  None    Chief Complaint  Patient presents with  . Contractions   Jody Warren:WJXBJ R Rommel is a 29 year old G1P0, [redacted]w[redacted]d with complaints of abdominal pain due to contractions that she started having this morning. The contractions have been going for 2 hrs with a frequency of 10 minutes between episodes, prior to coming to the hospital This is the first time this has occurred during her pregnancy. She does endorses vaginal bleeding and discharge that started after her contractions. She endorses nausea, vomiting and had an episode of vomiting on her way to the hospital. Denies hemoptysis, chest pain, dizziness, SOB, difficulty breathing, epigastric or abdominal pain,      Past Medical History  Diagnosis Date  . Asthma since birth   . Obesity   . Kidney stone     Past Surgical History  Procedure Laterality Date  . No past surgeries      Family History  Problem Relation Age of Onset  . Hyperlipidemia Mother   . Hypertension Father   . Diabetes Father   . Cancer Maternal Grandfather     History  Substance Use Topics  . Smoking status: Former Smoker -- 0.50 packs/day    Types: Cigarettes    Quit date: 02/03/2014  . Smokeless tobacco: Never Used  . Alcohol Use: No    Allergies:  Allergies  Allergen Reactions  . Peanuts [Peanut Oil] Swelling    Prescriptions prior to admission  Medication Sig Dispense Refill Last Dose  . Prenatal Vit-Fe Fumarate-FA (MULTIVITAMIN-PRENATAL) 27-0.8 MG TABS tablet Take 2 tablets by mouth daily at 12 noon.    12/07/2014 at Unknown time  . PROGESTERONE, VAGINAL, 8 % GEL Place 90 mg vaginally at bedtime. (Patient not taking: Reported on 11/12/2014) 1.125 g 11 Completed Course at Unknown time    Review of Systems  Constitutional: Negative for fever and chills.  Respiratory: Negative for  cough, hemoptysis and shortness of breath.  Cardiovascular: Negative for chest pain and palpitations.  Gastrointestinal: Positive for nausea and vomiting. Negative for heartburn, abdominal pain, diarrhea and constipation.  Skin: Negative for itching.  Neurological: Negative for dizziness and headaches.  :  Physical Exam   Blood pressure 139/80, pulse 106, temperature 98.7 F (37.1 C), temperature source Oral, resp. rate 18, height 5\' 5"  (1.651 m), weight 283 lb 6 oz (128.538 kg), last menstrual period 02/19/2014.  Physical Exam  Constitutional: She is oriented to person, place, and time.  Mild distress  HENT:  Head: Normocephalic and atraumatic.  Eyes: Conjunctivae and EOM are normal.  Cardiovascular: Normal rate, regular rhythm and normal heart sounds. Exam reveals no gallop and no friction rub.  No murmur heard. Respiratory: Effort normal and breath sounds normal. No respiratory distress. She has no wheezes. She has no rales.  GI: Soft. There is no tenderness.  Neurological: She is alert and oriented to person, place, and time.  Psychiatric: She has a normal mood and affect. Her behavior is normal. Judgment and thought content normal.  :   MAU Course  Procedures  MDM Zolfran adminstered to help with nausea  Assessment and Plan  Ms. Jody Warren, 28 year old G1P0, [redacted]w[redacted]d with abdominal pain likely due to her contractions that have been occuring since this morning.  There has been some associated nausea and vomiting after her contractions started, as well as some vaginal bleeding and discharge.  1) Nausea= Zolfran has been adminstered, continue to monitor 2) Vomiting= Continue  to monitor, depending on how much fluid loss, IV fluids may be indicated 3) Abdominal pain= Scheduled for biophysical profile via fetal U/S evaluation today.  Kandyce Rud. 12/07/2014, 11:38 AM         Note Info   MAU Provider Note by Kandyce Rud, MD at 12/07/2014 11:38 AM     Author: Kandyce Rud, MD Service: (none) Author Type: Resident   Filed: 12/07/2014 12:23 PM Note Time: 12/07/2014 11:38 AM Note Type: MAU Provider Note   Status: Cosign Needed Editor: Kandyce Rud, MD (Resident)   Related Notes: Original Note by Kandyce Rud, MD (Resident) filed at 12/07/2014 12:19 PM   Cosign Required: Yes        History    CSN: [id number]  Arrival date and time: 12/07/14 0951  None    Chief Complaint  Patient presents with  . Contractions   Jody Warren:WJXBJ R Jody Warren is a 29 year old G1P0, [redacted]w[redacted]d with complaints of abdominal pain due to contractions that she started having this morning. The contractions have been going for 2 hrs with a frequency of 10 minutes between episodes, prior to coming to the hospital This is the first time this has occurred during her pregnancy. She does endorses vaginal bleeding and discharge that started after her contractions. She endorses nausea, vomiting and had an episode of vomiting on her way to the hospital. Denies hemoptysis, chest pain, dizziness, SOB, difficulty breathing, epigastric or abdominal pain,      Past Medical History  Diagnosis Date  . Asthma since birth   . Obesity   . Kidney stone     Past Surgical History  Procedure Laterality Date  . No past surgeries      Family History  Problem Relation Age of Onset  . Hyperlipidemia Mother   . Hypertension Father   . Diabetes Father   . Cancer Maternal Grandfather     History  Substance Use Topics  . Smoking status: Former Smoker -- 0.50 packs/day    Types: Cigarettes    Quit date: 02/03/2014  . Smokeless tobacco: Never Used  . Alcohol Use: No    Allergies:  Allergies  Allergen Reactions  . Peanuts [Peanut Oil] Swelling    Prescriptions prior to admission  Medication Sig Dispense Refill Last Dose  . Prenatal Vit-Fe Fumarate-FA  (MULTIVITAMIN-PRENATAL) 27-0.8 MG TABS tablet Take 2 tablets by mouth daily at 12 noon.    12/07/2014 at Unknown time  . PROGESTERONE, VAGINAL, 8 % GEL Place 90 mg vaginally at bedtime. (Patient not taking: Reported on 11/12/2014) 1.125 g 11 Completed Course at Unknown time    Review of Systems  Constitutional: Negative for fever and chills.  Respiratory: Negative for cough, hemoptysis and shortness of breath.  Cardiovascular: Negative for chest pain and palpitations.  Gastrointestinal: Positive for nausea and vomiting. Negative for heartburn, abdominal pain, diarrhea and constipation.  Skin: Negative for itching.  Neurological: Negative for dizziness and headaches.  :  Physical Exam   Blood pressure 139/80, pulse 106, temperature 98.7 F (37.1 C), temperature source Oral, resp. rate 18, height 5\' 5"  (1.651 m), weight 283 lb 6 oz (128.538 kg), last menstrual period 02/19/2014.  Physical Exam  Constitutional: She is oriented to person, place, and time.  Mild distress  HENT:  Head: Normocephalic and atraumatic.  Eyes: Conjunctivae and EOM are normal.  Cardiovascular: Normal rate, regular rhythm and normal heart sounds. Exam reveals no gallop and no friction rub.  No murmur heard. Respiratory: Effort  normal and breath sounds normal. No respiratory distress. She has no wheezes. She has no rales.  GI: Soft. There is no tenderness.  Neurological: She is alert and oriented to person, place, and time.  Psychiatric: She has a normal mood and affect. Her behavior is normal. Judgment and thought content normal.  :   MAU Course  Procedures  MDM Zolfran adminstered to help with nausea  Assessment and Plan  Ms. Pucci, 29 year old G1P0, [redacted]w[redacted]d with abdominal pain likely due to her contractions that have been occuring since this morning.  There has been some associated nausea and vomiting after her contractions started, as well as some vaginal bleeding and  discharge.  1) Nausea= Zolfran has been adminstered, continue to monitor 2) Vomiting= Continue to monitor, depending on how much fluid loss, IV fluids may be indicated 3) Abdominal pain= Scheduled for biophysical profile via fetal U/S evaluation today.  Pt is to be admitted to L/d for non reassuring fetal status; See  My H/P note  Illene Bolus CNM 12/07/2014   Kandyce Rud. 12/07/2014, 11:38 AM

## 2014-12-31 ENCOUNTER — Encounter: Payer: Self-pay | Admitting: Advanced Practice Midwife

## 2014-12-31 ENCOUNTER — Ambulatory Visit (INDEPENDENT_AMBULATORY_CARE_PROVIDER_SITE_OTHER): Payer: Medicaid Other | Admitting: Advanced Practice Midwife

## 2014-12-31 ENCOUNTER — Ambulatory Visit: Payer: Medicaid Other | Admitting: Women's Health

## 2014-12-31 VITALS — BP 110/64 | Ht 65.0 in | Wt 264.0 lb

## 2014-12-31 DIAGNOSIS — R319 Hematuria, unspecified: Secondary | ICD-10-CM

## 2014-12-31 DIAGNOSIS — M5441 Lumbago with sciatica, right side: Secondary | ICD-10-CM

## 2014-12-31 DIAGNOSIS — Z1389 Encounter for screening for other disorder: Secondary | ICD-10-CM

## 2014-12-31 DIAGNOSIS — M5442 Lumbago with sciatica, left side: Secondary | ICD-10-CM | POA: Diagnosis not present

## 2014-12-31 LAB — POCT URINALYSIS DIPSTICK
Blood, UA: 4
GLUCOSE UA: NEGATIVE
Ketones, UA: NEGATIVE
Leukocytes, UA: NEGATIVE
Nitrite, UA: NEGATIVE
PROTEIN UA: NEGATIVE

## 2014-12-31 MED ORDER — SULFAMETHOXAZOLE-TRIMETHOPRIM 800-160 MG PO TABS
1.0000 | ORAL_TABLET | Freq: Two times a day (BID) | ORAL | Status: DC
Start: 2014-12-31 — End: 2015-01-14

## 2014-12-31 MED ORDER — FOSFOMYCIN TROMETHAMINE 3 G PO PACK: 3.0000 g | PACK | Freq: Once | ORAL | Status: DC

## 2014-12-31 MED ORDER — CEPHALEXIN 500 MG PO CAPS: 500.0000 mg | ORAL_CAPSULE | Freq: Three times a day (TID) | ORAL | Status: DC

## 2014-12-31 NOTE — Progress Notes (Signed)
[facility] Visit  Patient name: Jody Warren MRN [id number]  Date of birth: 1985-08-05  CC & HPI:  Jody Warren is a 29 y.o. African American female presenting today for c/o lower back pain   She has had several UTI's and feels "exactly like that".  However, she does have some radiation of pain down bottom and legs.  States butt is still numb from CS (was told that this could last up to a year1)  Pertinent History Reviewed:  Medical & Surgical Hx:   Past Medical History  Diagnosis Date  . Obesity   . Kidney stone   . Asthma since birth     no current meds   Past Surgical History  Procedure Laterality Date  . No past surgeries    . Cesarean section N/A 12/07/2014    Procedure: CESAREAN SECTION;  Surgeon: Catalina Antigua, MD;  Location: [facility];  Service: Obstetrics;  Laterality: N/A;   Family History  Problem Relation Age of Onset  . Hyperlipidemia Mother   . Hypertension Father   . Diabetes Father   . Cancer Maternal Grandfather     Current outpatient prescriptions:  .  Prenatal Vit-Fe Fumarate-FA (MULTIVITAMIN-PRENATAL) 27-0.8 MG TABS tablet, Take 2 tablets by mouth daily at 12 noon. , Disp: , Rfl:  .  cephALEXin (KEFLEX) 500 MG capsule, Take 1 capsule (500 mg total) by mouth 3 (three) times daily., Disp: 21 capsule, Rfl: 0 .  fosfomycin (MONUROL) 3 G PACK, Take 3 g by mouth once., Disp: 1 g, Rfl: 0 .  sulfamethoxazole-trimethoprim (BACTRIM DS,SEPTRA DS) 800-160 MG per tablet, Take 1 tablet by mouth 2 (two) times daily., Disp: 10 tablet, Rfl: 0 Social History: Reviewed -  reports that she quit smoking about 10 months ago. Her smoking use included Cigarettes. She smoked 0.50 packs per day. She has never used smokeless tobacco.  Review of Systems:   Constitutional: Negative for fever and chills Eyes: Negative for visual disturbances Respiratory: Negative for shortness of breath, dyspnea Cardiovascular: Negative for chest pain or palpitations   Gastrointestinal: Negative for vomiting, diarrhea and constipation; no abdominal pain Genitourinary: Negative for dysuria and urgency, vaginal irritation or itching Musculoskeletal: Negative for back pain, joint pain, myalgias  Neurological: Negative for dizziness and headaches    Objective Findings:  Vitals: BP 110/64 mmHg  Ht 5\' 5"  (1.651 m)  Wt 264 lb (119.75 kg)  BMI 43.93 kg/m2  Breastfeeding? Yes  Physical Examination: General appearance - alert, well appearing, and in no distress Mental status - alert, oriented to person, place, and time Back exam - full range of motion, no CVAT  Results for orders placed or performed in visit on 12/31/14 (from the past 24 hour(s))  POCT urinalysis dipstick   Collection Time: 12/31/14 10:45 AM  Result Value Ref Range   Color, UA     Clarity, UA     Glucose, UA neg    Bilirubin, UA     Ketones, UA neg    Spec Grav, UA     Blood, UA 4    pH, UA     Protein, UA neg    Urobilinogen, UA     Nitrite, UA neg    Leukocytes, UA Negative Negative     Had questions about BC. Disucssed IUD/Nexplanon/Paragard/Depo.    50% or more of this visit was spent in counseling and coordination of care.  15 minutes of face to face time.     Assessment & Plan:  A:   Possible UTI  sciatica P: Keflex  TID (breastfeeding)  Treat based on sx; culture urine   F/U as scheudled  for pp appt. Plans IUD (maybe)   CRESENZO-DISHMAN,Alycen Mack CNM 12/31/2014 11:59 AM

## 2014-12-31 NOTE — Patient Instructions (Signed)
Mirena IUD

## 2015-01-02 LAB — URINE CULTURE

## 2015-01-14 ENCOUNTER — Encounter: Payer: Self-pay | Admitting: Obstetrics and Gynecology

## 2015-01-14 ENCOUNTER — Ambulatory Visit (INDEPENDENT_AMBULATORY_CARE_PROVIDER_SITE_OTHER): Payer: Medicaid Other | Admitting: Obstetrics and Gynecology

## 2015-01-14 NOTE — Progress Notes (Signed)
Patient ID: Jody Warren, female   DOB: 09-05-1985, 29 y.o.   MRN: [id number] Pt here today for post partum visit. Pt denies any problems or concerns at this time except some drainage and moisture from rt end of c/s scar  Subjective:  Jody Warren is a 29 y.o. female who presents for a 4 weeks postpartum visit  S/p cesarean, pt notes a smell to incision. Patient concerns:none, bottle and breast Prenatal and intrapartum course notable for cesarean,    Patient is not sexually active.   The following portions of the patient's history were reviewed and updated as appropriate: allergies, current medications, past family history, past medical history, past surgical history and problem list.  Review of Systems    See Subjective, otherwise negative ROS.  Objective:  BP 100/56 mmHg  Ht  (1.651 m)  Wt 269 lb (122.018 kg)  BMI 44.76 kg/m2  Breastfeeding? No  General:  alert, cooperative and no distress     Lungs: clear to auscultation bilaterally  Heart:  regular rate and rhythm, S1, S2 normal, no murmur  Abdomen: soft, non-tender; bowel sounds normal; no masses,  no organomegaly   Vulva:  normal  Vagina: normal vagina  Cervix:  normal  Corpus: normal size, contour, position, consistency, mobility, non-tender  Adnexa:  normal adnexa          Assessment:  1.  postpartum exam.  2. Contraception: IUD insert asap 3.  Incision knot exposed, now removed  Plan:  Stitch granuloma, stitch remnants removed,  Needs IUD placement

## 2015-01-23 ENCOUNTER — Encounter: Payer: Self-pay | Admitting: Obstetrics and Gynecology

## 2015-01-23 ENCOUNTER — Ambulatory Visit (INDEPENDENT_AMBULATORY_CARE_PROVIDER_SITE_OTHER): Payer: Medicaid Other | Admitting: Obstetrics and Gynecology

## 2015-01-23 VITALS — BP 110/60 | Ht 65.0 in | Wt 279.0 lb

## 2015-01-23 DIAGNOSIS — Z3202 Encounter for pregnancy test, result negative: Secondary | ICD-10-CM

## 2015-01-23 DIAGNOSIS — Z30430 Encounter for insertion of intrauterine contraceptive device: Secondary | ICD-10-CM

## 2015-01-23 DIAGNOSIS — Z3200 Encounter for pregnancy test, result unknown: Secondary | ICD-10-CM

## 2015-01-23 LAB — POCT URINE PREGNANCY: PREG TEST UR: NEGATIVE

## 2015-01-23 NOTE — Addendum Note (Signed)
Addended by: Tilda Burrow on: 01/23/2015 09:32 AM   Modules accepted: Level of Service

## 2015-01-23 NOTE — Progress Notes (Signed)
Patient ID: Jody Warren, female   DOB: [birth year], 29 y.o.   MRN: [id number]  GYNECOLOGY CLINIC PROCEDURE NOTE  Jody Warren is a 29 y.o. G1P1001 here for Mirena IUD insertion.  No GYN concerns.  Last pap smear was on 03/2014 and was normal.baby is 6 wk postpartum. Birth control since delivery: abstinence  IUD Insertion Procedure Note Patient identified, informed consent performed, consent signed.   Discussed risks of irregular bleeding, cramping, infection, malpositioning or misplacement of the IUD outside the uterus which may require further procedure such as laparoscopy. Time out was performed.  Urine pregnancy test negative.  Speculum placed in the vagina.  Cervix visualized.  Cleaned with Betadine x 2.  Grasped anteriorly with a single tooth tenaculum.   Uterus sounded to 10 cm.   IUD placed per manufacturer's recommendations.  Strings trimmed to 3 cm. Tenaculum was removed, good hemostasis noted.  Patient tolerated procedure well.  Transvaginal Ultrasound  was not used to confirm IUD position  Patient was given post-procedure instructions.  She was advised to have backup contraception for one week.  Patient was also asked to check IUD strings periodically.  Follow up in 4 weeks for IUD check.    Tilda Burrow, MD Attending Obstetrician & Gynecologist [facility], [facility]

## 2015-02-11 ENCOUNTER — Telehealth: Payer: Self-pay | Admitting: Obstetrics and Gynecology

## 2015-02-11 NOTE — Telephone Encounter (Signed)
Pt states that she has been bleeding for about 2 weeks, pt states that the bleeding is just enough to fill a panty liner. Pt states that she does not have any clotting but she is having really bad cramping. Phone call was switched up front and an appointment was given.

## 2015-02-13 ENCOUNTER — Encounter: Payer: Self-pay | Admitting: Advanced Practice Midwife

## 2015-02-13 ENCOUNTER — Ambulatory Visit (INDEPENDENT_AMBULATORY_CARE_PROVIDER_SITE_OTHER): Payer: Medicaid Other | Admitting: Advanced Practice Midwife

## 2015-02-13 VITALS — Ht 65.0 in | Wt 269.0 lb

## 2015-02-13 DIAGNOSIS — Z30431 Encounter for routine checking of intrauterine contraceptive device: Secondary | ICD-10-CM | POA: Diagnosis not present

## 2015-02-13 DIAGNOSIS — N921 Excessive and frequent menstruation with irregular cycle: Secondary | ICD-10-CM | POA: Diagnosis not present

## 2015-02-13 DIAGNOSIS — Z975 Presence of (intrauterine) contraceptive device: Principal | ICD-10-CM

## 2015-02-13 DIAGNOSIS — N939 Abnormal uterine and vaginal bleeding, unspecified: Secondary | ICD-10-CM | POA: Diagnosis not present

## 2015-02-13 MED ORDER — MEGESTROL ACETATE 40 MG PO TABS: ORAL_TABLET | ORAL | Status: DC

## 2015-02-13 NOTE — Progress Notes (Signed)
   [facility] Visit  Patient name: Jody Warren MRN [id number]  Date of birth: [birth year]  CC & HPI:  Jody Warren is a 29 y.o. African American female presenting today for IUD check. She had Mirena placed about a monght ago, and is now having heavy bleeding and crampls. Bleeding less today.    Pertinent History Reviewed:  Medical & Surgical Hx:   Past Medical History  Diagnosis Date  . Obesity   . Kidney stone   . Asthma since birth     no current meds   Past Surgical History  Procedure Laterality Date  . No past surgeries    . Cesarean section N/A 12/07/2014    Procedure: CESAREAN SECTION;  Surgeon: Catalina Antigua, MD;  Location: [facility];  Service: Obstetrics;  Laterality: N/A;   Family History  Problem Relation Age of Onset  . Hyperlipidemia Mother   . Hypertension Father   . Diabetes Father   . Cancer Maternal Grandfather     Current outpatient prescriptions:  .  megestrol (MEGACE) 40 MG tablet, Take 3/day (at the same time) for 3 days; 2/day for 2days, then 1/day PO prn bleeding, Disp: 60 tablet, Rfl: 3 .  Prenatal Vit-Fe Fumarate-FA (MULTIVITAMIN-PRENATAL) 27-0.8 MG TABS tablet, Take 2 tablets by mouth daily at 12 noon. , Disp: , Rfl:  Social History: Reviewed -  reports that she quit smoking about a year ago. Her smoking use included Cigarettes. She smoked 0.50 packs per day. She has never used smokeless tobacco.  Review of Systems:   Constitutional: Negative for fever and chills Eyes: Negative for visual disturbances Respiratory: Negative for shortness of breath, dyspnea Cardiovascular: Negative for chest pain or palpitations  Gastrointestinal: Negative for vomiting, diarrhea and constipation; no abdominal pain Genitourinary: Negative for dysuria and urgency, vaginal irritation or itching Musculoskeletal: Negative for back pain, joint pain, myalgias  Neurological: Negative for dizziness and headaches    Objective Findings:  Vitals: Ht   (1.651 m)  Wt 269 lb (122.018 kg)  BMI 44.76 kg/m2  LMP 02/02/2015  Breastfeeding? No  Physical Examination: General appearance - alert, well appearing, and in no distress Mental status - alert, oriented to person, place, and time Abdomen - non tender Pelvic - SSE:  Mod amount blood coming from cx .Cx non friable. Strings visible. BedsBTBide US reveals IUD in fundus Musculoskeletal - full range of motion without pain Chest:  Normal respiratory effort  No results found for this or any previous visit (from the past 24 hour(s)).       Assessment & Plan:  A:   BTB on Mirena P:  Megace algorhythm     Jody Warren,Jody Warren CNM 02/13/2015 3:05 PM

## 2015-02-24 ENCOUNTER — Ambulatory Visit: Payer: Medicaid Other | Admitting: Obstetrics and Gynecology

## 2015-12-07 IMAGING — US US FETAL BPP W/O NONSTRESS
1 series · 9 of 9 positions shown · non-contrast
Comparison: none

[Series 1: us fetal bpp w/o nonstress · non-contrast · 9 of 9 slices shown]
[im 1/9]
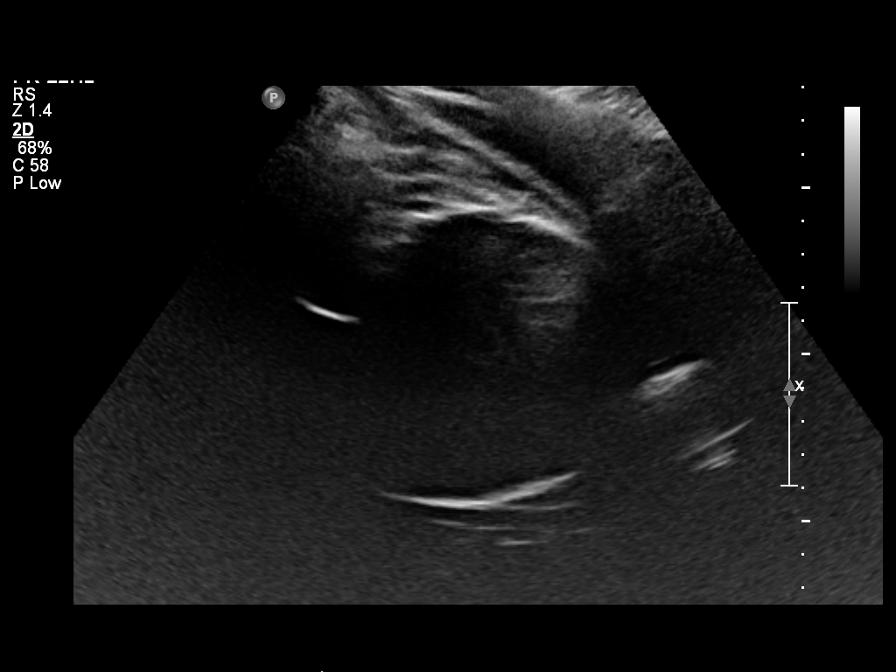
[im 2/9]
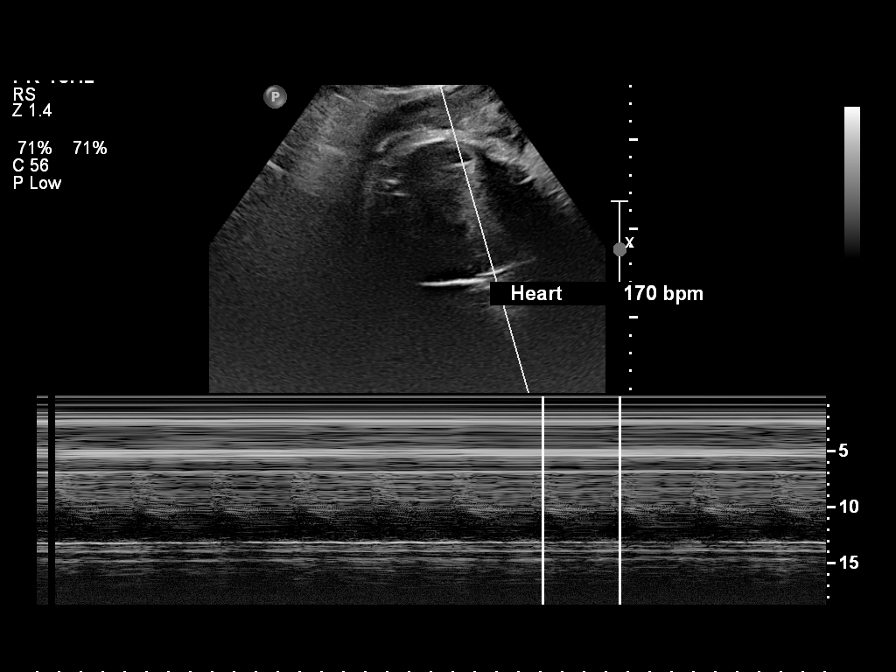
[im 3/9]
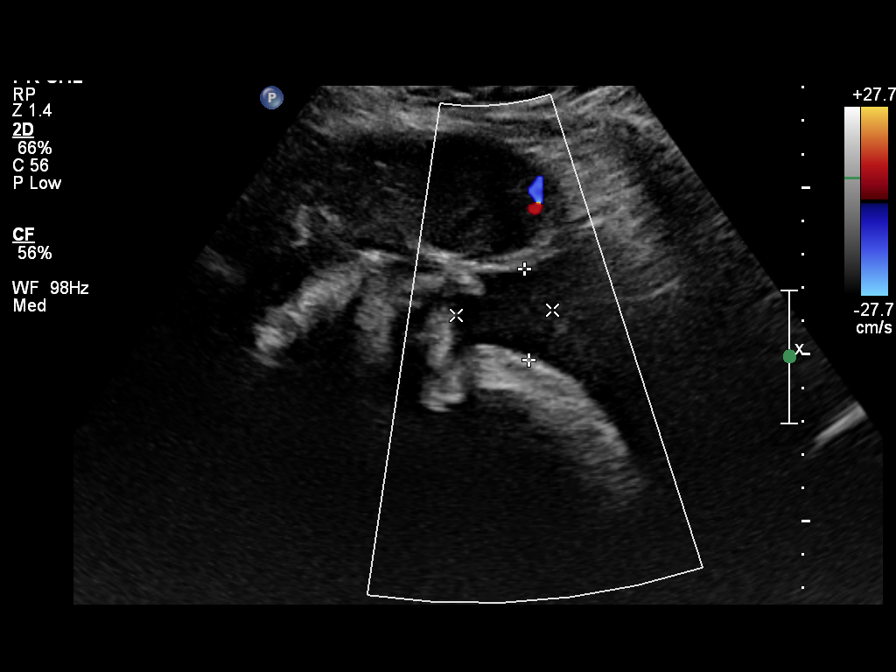
[im 4/9]
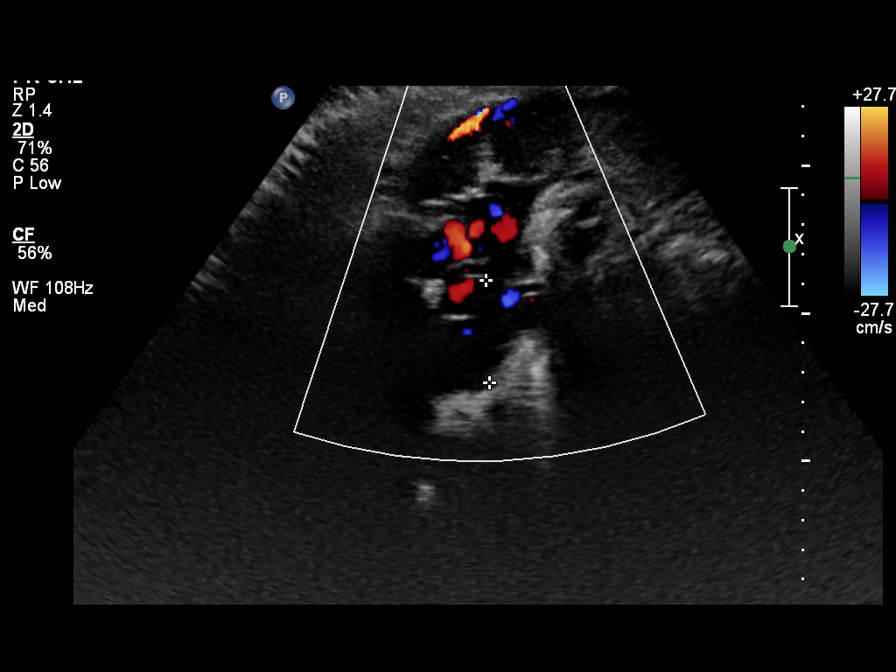
[im 5/9]
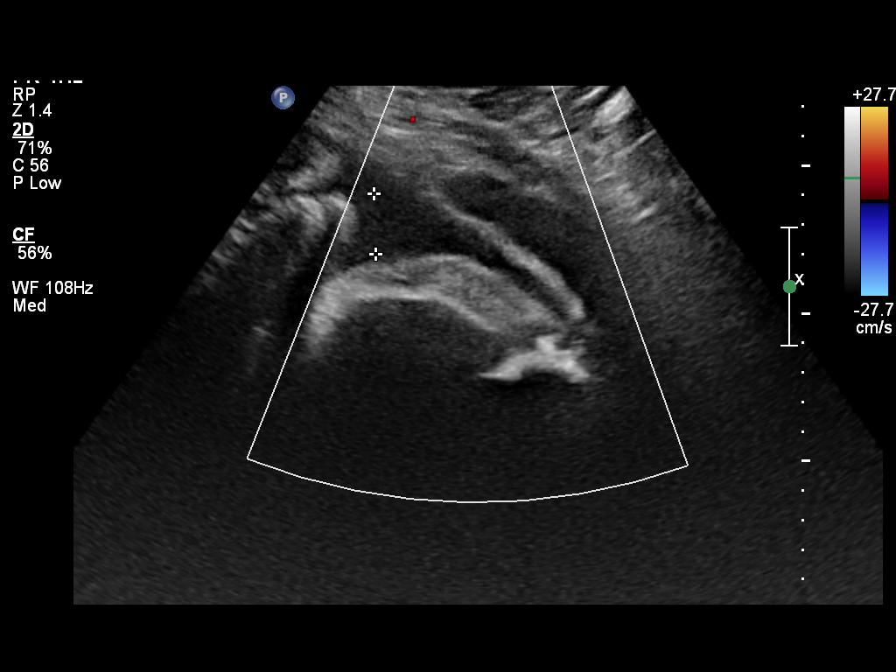
[im 6/9]
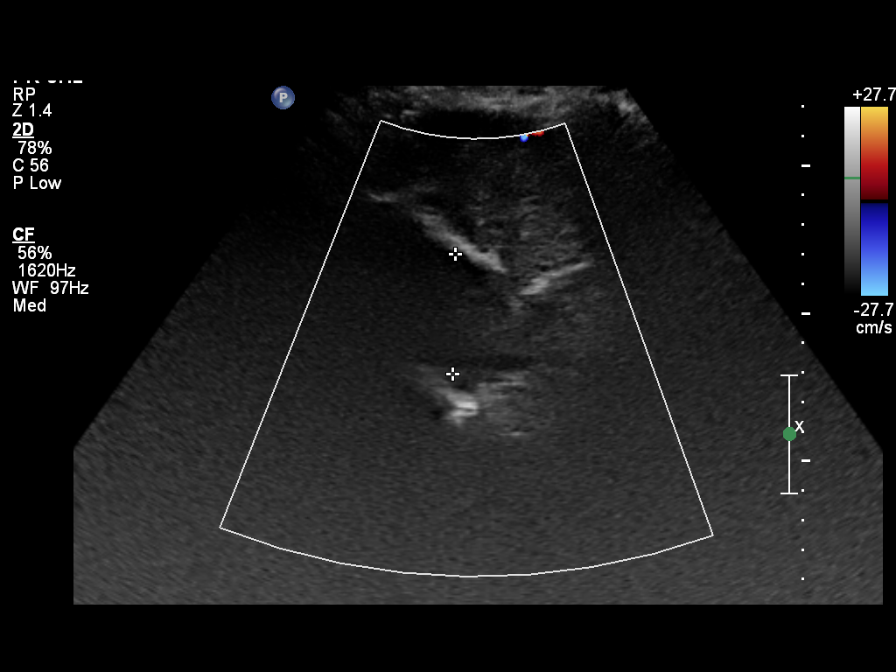
[im 7/9]
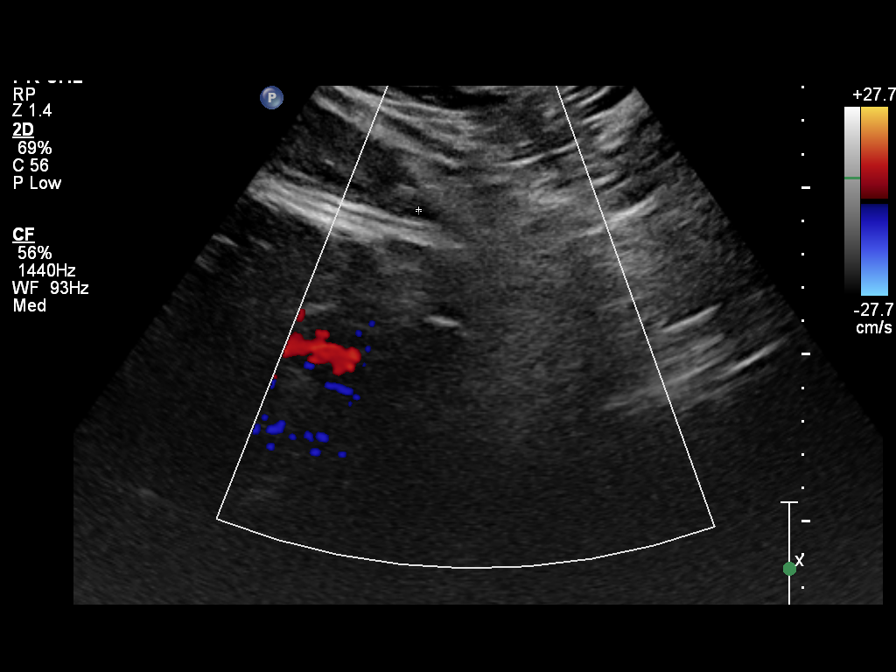
[im 8/9]
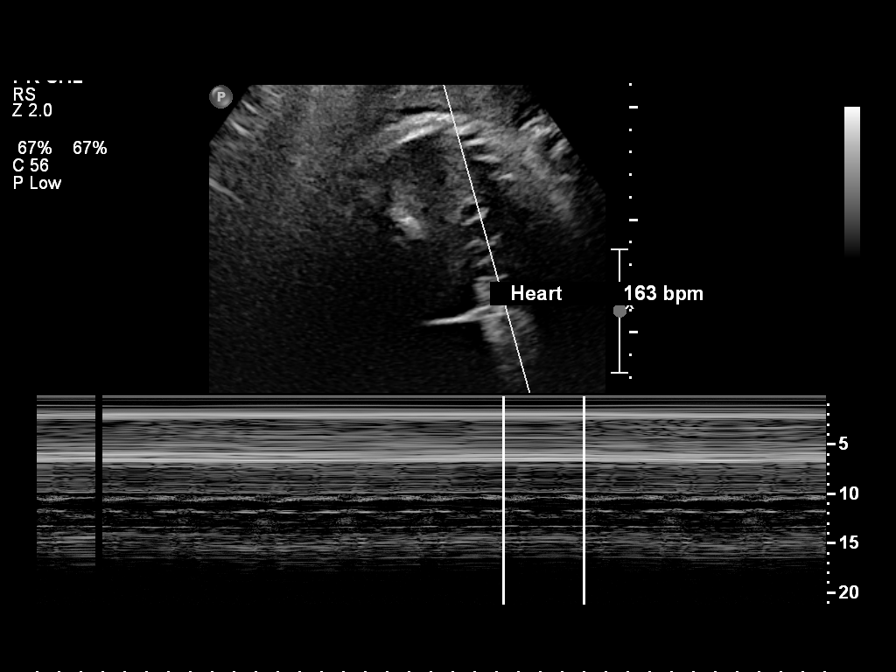
[im 9/9]
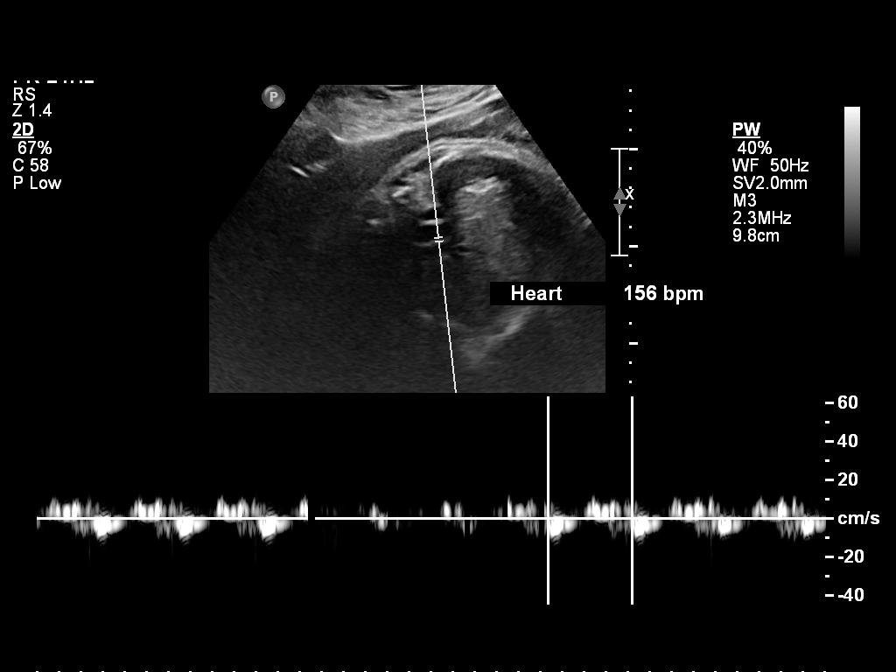

[9 of 9 positions shown; findings below may reference images not displayed]

OBSTETRICS REPORT
(Signed Final 12/07/2014 [DATE])

Service(s) Provided

Indications

Non-reactive NST
Abdominal pain in pregnancy
Vaginal bleeding, unknown etiology
Fetal Evaluation

Num Of Fetuses:    1
Fetal Heart Rate:  170                          bpm
Cardiac Activity:  Observed
Presentation:      Cephalic

Amniotic Fluid
AFI FV:      Subjectively low-normal
AFI Sum:     9.57    cm       29  %Tile     Larg Pckt:    4.04  cm
RUQ:   4.04    cm   RLQ:    0.05   cm    LUQ:   3.45    cm   LLQ:    2.03   cm
Biophysical Evaluation

Amniotic F.V:   Pocket => 2 cm two         F. Tone:         Not Observed
planes
F. Movement:    Not Observed               Score:           [DATE]
F. Breathing:   Not Observed
Gestational Age

Clinical EDD:  40w 5d                                        EDD:   12/02/14
Best:          40w 5d     Det. By:  Clinical EDD             EDD:   12/02/14
Impression

Singleton intrauterine pregnancy at 40 weeks 5 days
gestation with fetal cardiac activity
Cephalic presentation
BPP [DATE] (-2 for breathing, tone, and movement)
Recommendations

Follow-up ultrasounds as clinically indicated.

## 2018-02-11 ENCOUNTER — Other Ambulatory Visit: Payer: Self-pay

## 2018-02-11 ENCOUNTER — Encounter (HOSPITAL_COMMUNITY): Payer: Self-pay | Admitting: Emergency Medicine

## 2018-02-11 ENCOUNTER — Emergency Department (HOSPITAL_COMMUNITY)
Admission: EM | Admit: 2018-02-11 | Discharge: 2018-02-11 | Disposition: A | Discharge status: Home / Self Care | Payer: Self-pay | Attending: Emergency Medicine | Admitting: Emergency Medicine

## 2018-02-11 ENCOUNTER — Emergency Department (HOSPITAL_COMMUNITY): Payer: Self-pay

## 2018-02-11 DIAGNOSIS — Z79899 Other long term (current) drug therapy: Secondary | ICD-10-CM | POA: Insufficient documentation

## 2018-02-11 DIAGNOSIS — R1010 Upper abdominal pain, unspecified: Secondary | ICD-10-CM | POA: Insufficient documentation

## 2018-02-11 DIAGNOSIS — R112 Nausea with vomiting, unspecified: Secondary | ICD-10-CM | POA: Insufficient documentation

## 2018-02-11 DIAGNOSIS — J45909 Unspecified asthma, uncomplicated: Secondary | ICD-10-CM | POA: Insufficient documentation

## 2018-02-11 DIAGNOSIS — R197 Diarrhea, unspecified: Secondary | ICD-10-CM | POA: Insufficient documentation

## 2018-02-11 DIAGNOSIS — Z87891 Personal history of nicotine dependence: Secondary | ICD-10-CM | POA: Insufficient documentation

## 2018-02-11 LAB — COMPREHENSIVE METABOLIC PANEL
ALT: 18 U/L (ref 0–44)
ANION GAP: 7 (ref 5–15)
AST: 23 U/L (ref 15–41)
Albumin: 4.1 g/dL (ref 3.5–5.0)
Alkaline Phosphatase: 69 U/L (ref 38–126)
BUN: 12 mg/dL (ref 6–20)
CALCIUM: 9.6 mg/dL (ref 8.9–10.3)
CO2: 23 mmol/L (ref 22–32)
Chloride: 107 mmol/L (ref 98–111)
Creatinine, Ser: 0.73 mg/dL (ref 0.44–1.00)
GFR calc Af Amer: >60 mL/min (ref >60)
GFR calc non Af Amer: >60 mL/min (ref >60)
GLUCOSE: 121 mg/dL — AB (ref 70–99)
Potassium: 4.1 mmol/L (ref 3.5–5.1)
Sodium: 137 mmol/L (ref 135–145)
TOTAL PROTEIN: 8 g/dL (ref 6.5–8.1)
Total Bilirubin: 0.9 mg/dL (ref 0.3–1.2)

## 2018-02-11 LAB — CBC WITH DIFFERENTIAL/PLATELET
BASOS PCT: 0 %
Basophils Absolute: 0 10*3/uL (ref 0.0–0.1)
EOS PCT: 2 %
Eosinophils Absolute: 0.3 10*3/uL (ref 0.0–0.7)
HCT: 42.6 % (ref 36.0–46.0)
Hemoglobin: 14.5 g/dL (ref 12.0–15.0)
Lymphocytes Relative: 27 %
Lymphs Abs: 3 10*3/uL (ref 0.7–4.0)
MCH: 29.1 pg (ref 26.0–34.0)
MCHC: 34 g/dL (ref 30.0–36.0)
MCV: 85.4 fL (ref 78.0–100.0)
MONO ABS: 0.5 10*3/uL (ref 0.1–1.0)
MONOS PCT: 5 %
Neutro Abs: 7.5 10*3/uL (ref 1.7–7.7)
Neutrophils Relative %: 66 %
Platelets: 345 10*3/uL (ref 150–400)
RBC: 4.99 MIL/uL (ref 3.87–5.11)
RDW: 13.9 % (ref 11.5–15.5)
WBC: 11.3 10*3/uL — ABNORMAL HIGH (ref 4.0–10.5)

## 2018-02-11 LAB — URINALYSIS, ROUTINE W REFLEX MICROSCOPIC
Bilirubin Urine: NEGATIVE
GLUCOSE, UA: NEGATIVE mg/dL
Hgb urine dipstick: NEGATIVE
KETONES UR: NEGATIVE mg/dL
LEUKOCYTES UA: NEGATIVE
NITRITE: NEGATIVE
PROTEIN: NEGATIVE mg/dL
Specific Gravity, Urine: 1.02 (ref 1.005–1.030)
pH: 6 (ref 5.0–8.0)

## 2018-02-11 LAB — PREGNANCY, URINE: PREG TEST UR: NEGATIVE

## 2018-02-11 LAB — LIPASE, BLOOD: Lipase: 25 U/L (ref 11–51)

## 2018-02-11 MED ORDER — PROMETHAZINE HCL 25 MG PO TABS: 25.0000 mg | ORAL_TABLET | Freq: Four times a day (QID) | ORAL | 0 refills | Status: DC | PRN

## 2018-02-11 MED ORDER — FAMOTIDINE 20 MG PO TABS: 20.0000 mg | ORAL_TABLET | Freq: Two times a day (BID) | ORAL | 0 refills | Status: DC

## 2018-02-11 MED ORDER — ONDANSETRON HCL 4 MG/2ML IJ SOLN
4.0000 mg | Freq: Once | INTRAMUSCULAR | Status: AC
  Administered 2018-02-11: 4 mg via INTRAVENOUS
  Filled 2018-02-11: qty 2

## 2018-02-11 MED ORDER — MORPHINE SULFATE (PF) 4 MG/ML IV SOLN
4.0000 mg | Freq: Once | INTRAVENOUS | Status: AC
  Administered 2018-02-11: 4 mg via INTRAVENOUS
  Filled 2018-02-11: qty 1

## 2018-02-11 NOTE — ED Notes (Signed)
Patient transported to Ultrasound 

## 2018-02-11 NOTE — ED Triage Notes (Signed)
Pt c/o generalized abdominal cramping radiating to back that began around midnight. Endorses nausea and 4 episodes of diarrhea. No urinary symptoms or fever reported.

## 2018-02-11 NOTE — ED Notes (Signed)
Pt tolerated small sips of diet ginger ale. No complaints of nausea or vomiting.

## 2018-02-11 NOTE — ED Notes (Signed)
EDP at bedside  

## 2018-02-11 NOTE — ED Provider Notes (Signed)
[facility] EMERGENCY DEPARTMENT Provider Note   CSN: [id number] Arrival date & time: 02/11/18  0756     History   Chief Complaint Chief Complaint  Patient presents with  . Abdominal Pain    HPI Jody Warren is a 32 y.o. female with a history of asthma and kidney stones presenting with epigastric and right upper quadrant pain which started gradually this morning after awaking.  She describes intense aching pain which waxes and wanes, radiates to her mid back when severe.  In association she has had nausea without emesis and has had 5 episodes of nonbloody diarrhea.  She has had no fevers or chills, states she felt well yesterday.  Last p.o. intake was tacos last night.  She knows this food causes heartburn, so took antacids before and after this meal.  She denies having back or flank pain with her last kidney stone, rather had abdominal pain and abdominal fullness/constipation like symptoms.  She denies dysuria or hematuria, also no vaginal discharge, denies pregnancy, has IUD for birth control.  She also denies chest pain or shortness of breath.  She has had no treatments prior to arrival is found no alleviators for symptoms.  The history is provided by the patient.    Past Medical History:  Diagnosis Date  . Asthma since birth    no current meds  . Kidney stone   . Obesity     Patient Active Problem List   Diagnosis Date Noted  . Encounter for postpartum visit 01/14/2015  . Supervision of normal first pregnancy 04/18/2014  . Kidney stone   . OBESITY, UNSPECIFIED 07/14/2009  . MIGRAINE, CLASSICAL 07/14/2009  . Asthma, currently dormant 07/14/2009  . FATIGUE 07/14/2009    Past Surgical History:  Procedure Laterality Date  . CESAREAN SECTION N/A 12/07/2014   Procedure: CESAREAN SECTION;  Surgeon: Catalina Antigua, MD;  Location: [facility];  Service: Obstetrics;  Laterality: N/A;  . NO PAST SURGERIES       OB History    Gravida  1   Para  1   Term  1   Preterm        AB      Living  1     SAB      TAB      Ectopic      Multiple  0   Live Births  1            Home Medications    Prior to Admission medications   Medication Sig Start Date End Date Taking? Authorizing Provider  famotidine (PEPCID) 20 MG tablet Take 1 tablet (20 mg total) by mouth 2 (two) times daily. 02/11/18   Burgess Amor, PA-C  megestrol (MEGACE) 40 MG tablet Take 3/day (at the same time) for 3 days; 2/day for 2days, then 1/day PO prn bleeding 02/13/15   Cresenzo-Dishmon, Scarlette Calico, CNM  Prenatal Vit-Fe Fumarate-FA (MULTIVITAMIN-PRENATAL) 27-0.8 MG TABS tablet Take 2 tablets by mouth daily at 12 noon.     [provider]  promethazine (PHENERGAN) 25 MG tablet Take 1 tablet (25 mg total) by mouth every 6 (six) hours as needed for nausea or vomiting. 02/11/18   Burgess Amor, PA-C    Family History Family History  Problem Relation Age of Onset  . Hyperlipidemia Mother   . Hypertension Father   . Diabetes Father   . Cancer Maternal Grandfather     Social History Social History   Tobacco Use  . Smoking status: Former Smoker  Packs/day: 0.50    Types: Cigarettes    Last attempt to quit: 02/03/2014    Years since quitting: 4.0  . Smokeless tobacco: Never Used  Substance Use Topics  . Alcohol use: No  . Drug use: No     Allergies   Peanuts [peanut oil]   Review of Systems Review of Systems  Constitutional: Negative for chills and fever.  HENT: Negative for congestion and sore throat.   Eyes: Negative.   Respiratory: Negative for chest tightness and shortness of breath.   Cardiovascular: Negative for chest pain.  Gastrointestinal: Positive for abdominal pain, diarrhea and nausea. Negative for vomiting.  Genitourinary: Negative.  Negative for dysuria, hematuria and vaginal discharge.  Musculoskeletal: Negative for arthralgias, joint swelling and neck pain.  Skin: Negative.  Negative for rash and wound.  Neurological: Negative for dizziness,  weakness, light-headedness, numbness and headaches.  Psychiatric/Behavioral: Negative.      Physical Exam Updated Vital Signs BP 121/67   Pulse 89   Temp 99.6 F (37.6 C) (Oral)   Resp 14   Ht 5\' 5"  (1.651 m)   Wt 127 kg   SpO2 96%   BMI 46.59 kg/m   Physical Exam  Constitutional: She appears well-developed and well-nourished.  HENT:  Head: Normocephalic and atraumatic.  Eyes: Conjunctivae are normal.  Neck: Normal range of motion.  Cardiovascular: Normal rate, regular rhythm, normal heart sounds and intact distal pulses.  Pulmonary/Chest: Effort normal and breath sounds normal. She has no wheezes.  Abdominal: Soft. Bowel sounds are normal. She exhibits no mass. There is no hepatosplenomegaly. There is tenderness in the epigastric area. There is no rigidity, no rebound, no guarding, no CVA tenderness and negative Murphy's sign.  Musculoskeletal: Normal range of motion.  Neurological: She is alert.  Skin: Skin is warm and dry.  Psychiatric: She has a normal mood and affect.  Nursing note and vitals reviewed.    ED Treatments / Results  Labs (all labs ordered are listed, but only abnormal results are displayed) Labs Reviewed  COMPREHENSIVE METABOLIC PANEL - Abnormal; Notable for the following components:      Result Value   Glucose, Bld 121 (*)    All other components within normal limits  CBC WITH DIFFERENTIAL/PLATELET - Abnormal; Notable for the following components:   WBC 11.3 (*)    All other components within normal limits  URINALYSIS, ROUTINE W REFLEX MICROSCOPIC  PREGNANCY, URINE  LIPASE, BLOOD    EKG None  Radiology US Abdomen Complete  Result Date: 02/11/2018 CLINICAL DATA:  Right upper quadrant and epigastric pain since last evening. Nausea. EXAM: ABDOMEN ULTRASOUND COMPLETE COMPARISON:  CT scan from 2015. FINDINGS: Gallbladder: No gallstones or wall thickening visualized. No sonographic Murphy sign noted by sonographer. Common bile duct: Diameter: 4.0  mm Liver: Diffuse increased echogenicity with poor definition of the liver architecture and poor through transmission suggesting fatty infiltration. No focal hepatic lesions or intrahepatic biliary dilatation. Portal vein is patent on color Doppler imaging with normal direction of blood flow towards the liver. IVC: Normal caliber are Pancreas: Poorly visualized due to poor sonographic window/bowel gas. Spleen: Normal size.  No focal lesions. Right Kidney: Length: 12.3 cm. Normal renal cortical thickness and echogenicity without focal lesions or hydronephrosis. Left Kidney: Length: 12.0 cm. Normal renal cortical thickness and echogenicity without focal lesions or hydronephrosis. Abdominal aorta: Normal caliber Other findings: None. IMPRESSION: Diffuse fatty infiltration of the liver but otherwise unremarkable abdominal ultrasound examination. Poor visualization of the pancreas due to poor  sonographic window. Electronically Signed   By: Rudie MeyerP.  Gallerani M.D.   On: 02/11/2018 10:57    Procedures Procedures (including critical care time)  Medications Ordered in ED Medications  morphine 4 MG/ML injection 4 mg (4 mg Intravenous Given 02/11/18 0837)  ondansetron (ZOFRAN) injection 4 mg (4 mg Intravenous Given 02/11/18 0837)     Initial Impression / Assessment and Plan / ED Course  I have reviewed the triage vital signs and the nursing notes.  Pertinent labs & imaging results that were available during my care of the patient were reviewed by me and considered in my medical decision making (see chart for details).     Pt pain free after morphine 4 mg x 1.  No n/v/d while in dept. No return of abdominal pain while here, exam reassuring , recheck prior to dc, no acute abd findings.  No lower abdominal pain, doubt appendicitis, US negative for evidence of gallbladder disease or kidney stones/hydronephrosis.  Suspect gastritis vs GI virus.  Phenergan, pepcid, imodium prn.  Plan f/u with pcp as needed.  Return  precautions discussed.  Final Clinical Impressions(s) / ED Diagnoses   Final diagnoses:  Pain of upper abdomen  Nausea vomiting and diarrhea    ED Discharge Orders         Ordered    promethazine (PHENERGAN) 25 MG tablet  Every 6 hours PRN     02/11/18 1104    famotidine (PEPCID) 20 MG tablet  2 times daily     02/11/18 1108           Burgess Amordol, Gery Sabedra, PA-C 02/11/18 1110    Bethann BerkshireZammit, Joseph, MD 02/11/18 1600

## 2018-02-11 NOTE — Discharge Instructions (Signed)
Your labs, exam and ultrasound are negative for the source of your symptoms, which I suspect may be related to either a stomach flu (viral infection) gastritis from the [place] food you ate last night.  You do not have kidney stones or gallstones. Use the medicines as prescribed if needed for continued symptoms.  You may also take imodium (no prescription needed)  if needed for continued diarrhea, but since you did not have this symptom here, you may not need this medicine.

## 2018-02-11 NOTE — ED Notes (Signed)
Pt returned from US

## 2018-02-20 ENCOUNTER — Encounter (HOSPITAL_COMMUNITY): Payer: Self-pay | Admitting: Emergency Medicine

## 2018-02-20 ENCOUNTER — Encounter (HOSPITAL_COMMUNITY): Payer: Self-pay

## 2018-02-20 ENCOUNTER — Other Ambulatory Visit: Payer: Self-pay

## 2018-02-20 ENCOUNTER — Emergency Department (HOSPITAL_COMMUNITY)
Admission: EM | Admit: 2018-02-20 | Discharge: 2018-02-20 | Disposition: A | Discharge status: Home / Self Care | Payer: Self-pay | Attending: Emergency Medicine | Admitting: Emergency Medicine

## 2018-02-20 DIAGNOSIS — R740 Nonspecific elevation of levels of transaminase and lactic acid dehydrogenase [LDH]: Secondary | ICD-10-CM | POA: Insufficient documentation

## 2018-02-20 DIAGNOSIS — R7401 Elevation of levels of liver transaminase levels: Secondary | ICD-10-CM

## 2018-02-20 DIAGNOSIS — Z79899 Other long term (current) drug therapy: Secondary | ICD-10-CM | POA: Insufficient documentation

## 2018-02-20 DIAGNOSIS — Z91010 Allergy to peanuts: Secondary | ICD-10-CM | POA: Insufficient documentation

## 2018-02-20 DIAGNOSIS — R1010 Upper abdominal pain, unspecified: Secondary | ICD-10-CM | POA: Insufficient documentation

## 2018-02-20 DIAGNOSIS — J45909 Unspecified asthma, uncomplicated: Secondary | ICD-10-CM | POA: Insufficient documentation

## 2018-02-20 DIAGNOSIS — R1013 Epigastric pain: Secondary | ICD-10-CM | POA: Insufficient documentation

## 2018-02-20 DIAGNOSIS — Z5321 Procedure and treatment not carried out due to patient leaving prior to being seen by health care provider: Secondary | ICD-10-CM | POA: Insufficient documentation

## 2018-02-20 LAB — COMPREHENSIVE METABOLIC PANEL
ALBUMIN: 4.1 g/dL (ref 3.5–5.0)
ALT: 172 U/L — ABNORMAL HIGH (ref 0–44)
ANION GAP: 9 (ref 5–15)
AST: 323 U/L — ABNORMAL HIGH (ref 15–41)
Alkaline Phosphatase: 87 U/L (ref 38–126)
BUN: 10 mg/dL (ref 6–20)
CHLORIDE: 106 mmol/L (ref 98–111)
CO2: 24 mmol/L (ref 22–32)
Calcium: 9.4 mg/dL (ref 8.9–10.3)
Creatinine, Ser: 0.76 mg/dL (ref 0.44–1.00)
GFR calc non Af Amer: >60 mL/min (ref >60)
GLUCOSE: 112 mg/dL — AB (ref 70–99)
Potassium: 4 mmol/L (ref 3.5–5.1)
SODIUM: 139 mmol/L (ref 135–145)
Total Bilirubin: 1.4 mg/dL — ABNORMAL HIGH (ref 0.3–1.2)
Total Protein: 7.7 g/dL (ref 6.5–8.1)

## 2018-02-20 LAB — CBC WITH DIFFERENTIAL/PLATELET
BASOS ABS: 0 10*3/uL (ref 0.0–0.1)
BASOS PCT: 0 %
Eosinophils Absolute: 0.2 10*3/uL (ref 0.0–0.7)
Eosinophils Relative: 1 %
HCT: 41 % (ref 36.0–46.0)
HEMOGLOBIN: 13.8 g/dL (ref 12.0–15.0)
LYMPHS ABS: 1.7 10*3/uL (ref 0.7–4.0)
Lymphocytes Relative: 16 %
MCH: 28.9 pg (ref 26.0–34.0)
MCHC: 33.7 g/dL (ref 30.0–36.0)
MCV: 86 fL (ref 78.0–100.0)
Monocytes Absolute: 0.4 10*3/uL (ref 0.1–1.0)
Monocytes Relative: 4 %
NEUTROS PCT: 79 %
Neutro Abs: 8.3 10*3/uL — ABNORMAL HIGH (ref 1.7–7.7)
Platelets: 342 10*3/uL (ref 150–400)
RBC: 4.77 MIL/uL (ref 3.87–5.11)
RDW: 13.8 % (ref 11.5–15.5)
WBC: 10.5 10*3/uL (ref 4.0–10.5)

## 2018-02-20 LAB — URINALYSIS, ROUTINE W REFLEX MICROSCOPIC
Bilirubin Urine: NEGATIVE
Glucose, UA: NEGATIVE mg/dL
Hgb urine dipstick: NEGATIVE
Ketones, ur: NEGATIVE mg/dL
Leukocytes, UA: NEGATIVE
NITRITE: NEGATIVE
PH: 9 — AB (ref 5.0–8.0)
Protein, ur: NEGATIVE mg/dL
SPECIFIC GRAVITY, URINE: 1.015 (ref 1.005–1.030)

## 2018-02-20 LAB — I-STAT BETA HCG BLOOD, ED (MC, WL, AP ONLY)

## 2018-02-20 LAB — LIPASE, BLOOD: Lipase: 24 U/L (ref 11–51)

## 2018-02-20 MED ORDER — SODIUM CHLORIDE 0.9 % IV BOLUS
1000.0000 mL | Freq: Once | INTRAVENOUS | Status: AC
  Administered 2018-02-20: 1000 mL via INTRAVENOUS

## 2018-02-20 MED ORDER — GI COCKTAIL ~~LOC~~
30.0000 mL | Freq: Once | ORAL | Status: AC
  Administered 2018-02-20: 30 mL via ORAL
  Filled 2018-02-20: qty 30

## 2018-02-20 NOTE — Discharge Instructions (Addendum)
You were evaluated today for abdominal pain.  Your liver enzymes were elevated.  I spoke with GI and they would like you to follow-up in the outpatient setting for repeat testing.  Please follow-up with them within the week.  Return to the ED with any new or worsening symptoms such as: Get help right away if: Your pain does not go away as soon as your health care provider told you to expect. You cannot stop throwing up. Your pain is only in areas of the abdomen, such as the right side or the left lower portion of the abdomen. You have bloody or black stools, or stools that look like tar. You have severe pain, cramping, or bloating in your abdomen. You have signs of dehydration, such as: Dark urine, very little urine, or no urine. Cracked lips. Dry mouth. Sunken eyes. Sleepiness. Weakness.

## 2018-02-20 NOTE — ED Triage Notes (Signed)
Pt c/o upper abd pain radiating around to her back.  Reports n/v/d.

## 2018-02-20 NOTE — ED Triage Notes (Signed)
Pt with c/o upper abdominal pain that radiates around to back.

## 2018-02-20 NOTE — ED Provider Notes (Signed)
[facility] Provider Note   CSN: [id number] Arrival date & time: 02/20/18  0830   History   Chief Complaint Chief Complaint  Patient presents with  . Abdominal Pain    HPI CALLAWAY HARDIGREE is a 32 y.o. female with a past medical history significant for GERD and obesity who presents for evaluation of epigastric pain. Pain began at approximately 1am today. Pain has been constant in nature. Pain is located to the RUQ and epigastric area. Has had this 1 week ago with a negative [place] for gallstones and told to FU with PCP. Describes her pain as a cramping, aching pain. Pain is rated a 7/10. Admits to burning sensation in her throat when she lays flat. States she has been trying to avoid greasy, fatty foods since last week however at pepperoni pizza last night at around 10 pm. Denies fever, chills, vomiting, nausea, chest pain, SOB, dysuria. Admits to 2 episodes of non-bloody diarrhea since symptom onset.  Denies chronic Tylenol or ibuprofen use for pain. HPI  Past Medical History:  Diagnosis Date  . Asthma since birth    no current meds  . Kidney stone   . Obesity     Patient Active Problem List   Diagnosis Date Noted  . Encounter for postpartum visit 01/14/2015  . Supervision of normal first pregnancy 04/18/2014  . Kidney stone   . OBESITY, UNSPECIFIED 07/14/2009  . MIGRAINE, CLASSICAL 07/14/2009  . Asthma, currently dormant 07/14/2009  . FATIGUE 07/14/2009    Past Surgical History:  Procedure Laterality Date  . CESAREAN SECTION N/A 12/07/2014   Procedure: CESAREAN SECTION;  Surgeon: Mora Bellman, MD;  Location: [facility];  Service: Obstetrics;  Laterality: N/A;  . NO PAST SURGERIES       OB History    Gravida  1   Para  1   Term  1   Preterm      AB      Living  1     SAB      TAB      Ectopic      Multiple  0   Live Births  1            Home Medications    Prior to Admission medications   Medication Sig Start Date End Date  Taking? Authorizing Provider  Calcium Carbonate-Simethicone (ALKA-SELTZER HEARTBURN + GAS) 750-80 MG CHEW Chew 3 tablets by mouth daily as needed (heart burn).   Yes [provider]  Probiotic Product (PROBIOTIC PO) Take 2 tablets by mouth daily.   Yes [provider]  famotidine (PEPCID) 20 MG tablet Take 1 tablet (20 mg total) by mouth 2 (two) times daily. Patient not taking: Reported on 02/20/2018 02/11/18   Evalee Jefferson, PA-C  megestrol (MEGACE) 40 MG tablet Take 3/day (at the same time) for 3 days; 2/day for 2days, then 1/day PO prn bleeding Patient not taking: Reported on 02/20/2018 02/13/15   Cresenzo-Dishmon, Joaquim Lai, CNM  promethazine (PHENERGAN) 25 MG tablet Take 1 tablet (25 mg total) by mouth every 6 (six) hours as needed for nausea or vomiting. Patient not taking: Reported on 02/20/2018 02/11/18   Evalee Jefferson, PA-C    Family History Family History  Problem Relation Age of Onset  . Hyperlipidemia Mother   . Hypertension Father   . Diabetes Father   . Cancer Maternal Grandfather     Social History Social History   Tobacco Use  . Smoking status: Former Smoker  Packs/day: 0.50    Types: Cigarettes    Last attempt to quit: 02/03/2014    Years since quitting: 4.0  . Smokeless tobacco: Never Used  Substance Use Topics  . Alcohol use: No  . Drug use: No     Allergies   Peanuts [peanut oil]   Review of Systems Review of Systems  Constitutional: Negative for activity change, appetite change, chills, diaphoresis, fatigue and fever.  Respiratory: Negative.   Cardiovascular: Negative.   Gastrointestinal: Positive for abdominal pain and diarrhea. Negative for abdominal distention, blood in stool, constipation, nausea and vomiting.  Genitourinary: Negative for decreased urine volume, difficulty urinating, flank pain, hematuria, pelvic pain, vaginal discharge and vaginal pain.  Musculoskeletal: Negative for back pain.  Skin: Negative.      Physical  Exam Updated Vital Signs BP 112/63   Pulse 79   Temp 98.4 F (36.9 C) (Oral)   Resp 18   Ht 5' 5"  (1.651 m)   Wt 127 kg   SpO2 99%   BMI 46.59 kg/m   Physical Exam  Constitutional: She appears well-developed and well-nourished.  Non-toxic appearance. She does not appear ill. No distress.  HENT:  Head: Atraumatic.  Eyes: Pupils are equal, round, and reactive to light.  Neck: Normal range of motion. Neck supple.  Cardiovascular: Normal rate, regular rhythm, normal heart sounds and intact distal pulses.  Pulmonary/Chest: Effort normal and breath sounds normal. No stridor. No respiratory distress. She has no wheezes. She has no rhonchi. She has no rales. She exhibits no tenderness.  Abdominal: Soft. Normal appearance and bowel sounds are normal. She exhibits no distension, no pulsatile liver, no fluid wave, no ascites, no pulsatile midline mass and no mass. There is no hepatosplenomegaly. There is tenderness in the right upper quadrant and epigastric area. There is no rigidity, no rebound, no guarding, no CVA tenderness, no tenderness at McBurney's point and negative Murphy's sign.  Musculoskeletal: Normal range of motion.  Neurological: She is alert.  Skin: Skin is warm and dry. No rash noted. She is not diaphoretic. No erythema. No pallor.  Psychiatric: She has a normal mood and affect.  Nursing note and vitals reviewed.    ED Treatments / Results  Labs (all labs ordered are listed, but only abnormal results are displayed) Labs Reviewed  CBC WITH DIFFERENTIAL/PLATELET - Abnormal; Notable for the following components:      Result Value   Neutro Abs 8.3 (*)    All other components within normal limits  COMPREHENSIVE METABOLIC PANEL - Abnormal; Notable for the following components:   Glucose, Bld 112 (*)    AST 323 (*)    ALT 172 (*)    Total Bilirubin 1.4 (*)    All other components within normal limits  URINALYSIS, ROUTINE W REFLEX MICROSCOPIC - Abnormal; Notable for the  following components:   pH 9.0 (*)    All other components within normal limits  LIPASE, BLOOD  I-STAT BETA HCG BLOOD, ED ([facility], [facility], [facility] ONLY)    EKG None  Radiology No results found.  Procedures Procedures (including critical care time)  Medications Ordered in ED Medications  sodium chloride 0.9 % bolus 1,000 mL (1,000 mLs Intravenous New Bag/Given 02/20/18 0920)  gi cocktail (Maalox,Lidocaine,Donnatal) (30 mLs Oral Given 02/20/18 0917)     Initial Impression / Assessment and Plan / ED Course  I have reviewed the triage vital signs and the nursing notes as well as past medical history.  Pertinent labs & imaging results that were  available during my care of the patient were reviewed by me and considered in my medical decision making (see chart for details).  32 year old well appearing female presents for evaluation of abdominal pain. Afebrile, non-ill, non-septic appearing. I have review the records from her pervious ED visit. [place] negative for gallstones or gallbladder wall thickening.  Negative labs at that time.   Mild abd tenderness in epigastric region on exam. Will obtain labs, urine and Gi cocktail and reevaluate.   On re-evalaution pain is resolved with Gi cocktail. Continued benign abdominal exam.  Patient does not meet the SIRS or Sepsis criteria.  On repeat exam patient does not have a surgical abdomin and there are no peritoneal signs. No indication of appendicitis, bowel obstruction, bowel perforation, diverticulitis, PID or ectopic pregnancy. No rebound or guarding.  AST 323, ALT 172, total bili 1.4, alk phos 87.  Given negative ultrasound 1 week ago will consult GI for possible CT scan or outpatient follow-up for trending of labs.  Consulted with GI, Dr. Gala Romney, feels patient can follow-up outpatient for trending of the labs as she is asymptomatic at this time. Discussed with patient strict return precautions.  Patient voices understanding.  Stable for discharge home at  this time.    Final Clinical Impressions(s) / ED Diagnoses   Final diagnoses:  Epigastric pain  Elevated AST (SGOT)  Elevated ALT measurement    ED Discharge Orders    None       Elia Keenum A, PA-C 02/20/18 1738    Elnora Morrison, MD 02/24/18 1719

## 2018-02-27 ENCOUNTER — Encounter: Payer: Self-pay | Admitting: Gastroenterology

## 2018-06-08 ENCOUNTER — Other Ambulatory Visit (HOSPITAL_COMMUNITY)
Admission: RE | Admit: 2018-06-08 | Discharge: 2018-06-08 | Disposition: A | Discharge status: Home / Self Care | Payer: Self-pay | Source: Ambulatory Visit | Attending: Gastroenterology | Admitting: Gastroenterology

## 2018-06-08 ENCOUNTER — Encounter: Payer: Self-pay | Admitting: Gastroenterology

## 2018-06-08 ENCOUNTER — Ambulatory Visit: Payer: Self-pay | Admitting: Gastroenterology

## 2018-06-08 VITALS — BP 117/77 | HR 63 | Temp 97.9°F | Ht 65.0 in | Wt 271.2 lb

## 2018-06-08 DIAGNOSIS — R748 Abnormal levels of other serum enzymes: Secondary | ICD-10-CM

## 2018-06-08 DIAGNOSIS — R1013 Epigastric pain: Secondary | ICD-10-CM

## 2018-06-08 LAB — HEPATIC FUNCTION PANEL
ALBUMIN: 3.9 g/dL (ref 3.5–5.0)
ALT: 16 U/L (ref 0–44)
AST: 22 U/L (ref 15–41)
Alkaline Phosphatase: 63 U/L (ref 38–126)
Bilirubin, Direct: <0.1 mg/dL (ref 0.0–0.2)
Total Bilirubin: 0.8 mg/dL (ref 0.3–1.2)
Total Protein: 7.5 g/dL (ref 6.5–8.1)

## 2018-06-08 NOTE — Progress Notes (Signed)
cc'd to pcp 

## 2018-06-08 NOTE — Assessment & Plan Note (Signed)
33 year old delightful female who presents after acute onset of epigastric pain starting in mid September, now significantly improved with dietary changes and has actually lost a self-reported 30 lbs and 2 pant sizes. Gallbladder present. Fatty liver on ultrasound but no gallstones. GERD is much improved and nearly resolved with dietary changes as well. Differentials gastritis, biliary dyskinesia, no concern for pancreatitis or other etiology. No need for EGD at this time, as she is doing well, and she has no alarm signs/symptoms. However, there was a notable bump in transaminases during ED visit, and we will recheck this now. May need further imaging or serologies depending on lab results. I suspect this will have normalized. If further concern for biliary symptoms in the future, pursue HIDA.

## 2018-06-08 NOTE — Patient Instructions (Signed)
You may take famotidine as needed. Let me know if you ever need anything stronger.  I have ordered repeat liver numbers to be done at the hospital, and I will message you in MyChart.  Continue the great work with diet changes, weight loss, and healthy lifestyle!  Further recommendations to follow!  It was a pleasure to see you today. I strive to create trusting relationships with patients to provide genuine, compassionate, and quality care. I value your feedback. If you receive a survey regarding your visit,  I greatly appreciate you taking time to fill this out.   Gelene Mink, PhD, ANP-BC [facility]

## 2018-06-08 NOTE — Progress Notes (Signed)
Ciarra: your liver numbers are completely normal! They have returned to baseline. Continue with the good eating habits. Let me know if you have any persistent pain, and we will do further evaluation via a HIDA scan. (sent in MyChart).   Stacey: please arrange an appt in 3 months.

## 2018-06-08 NOTE — Assessment & Plan Note (Signed)
Repeat HFP now.

## 2018-06-08 NOTE — Progress Notes (Signed)
Primary Care Physician:  Kerri Perches, MD  Referring Physician: [facility] Primary Gastroenterologist:  Dr. Darrick Penna   Chief Complaint  Patient presents with  . Abdominal Pain    Has now subsided. Had episode on tuesday. Was told she had fatty liver in the ED in sept. Patient has changed eating habits and has lost 30lbs.  Jody Warren    HPI:   [name] is a 33 y.o. female presenting today at the request of the ED due to epigastric pain.    Pain onset mid September, upper abdomen, given medication in ED for acute pain. First episode was after tacos. Two weeks later, had recurrent pain and had eaten pizza. Felt better and went home from ED without being seen. Next morning woke up and tasted like hot sauce. Recurrent pain. Had to go back to hospital that next day. Given a GI cocktail, which helped a lot. Was told she had a fatty liver and needed to change her eating habits. Started to avoid fried foods, pizza, etc. Eats one salad a day with Jody Warren. Lost 30 lbs since September per her scales and had only one episode of pain after eating cheese puffs. Had associated diarrhea with pain. This past Tuesday evening ate lettuce Tuesday and smart food popcorn and started to have recurrent pain, drank pepto and then vomited undigested food then felt better. Overall, symptoms much improved since dietary changes. As of note, ED weight is 280, but this was a self-reported weight and may not be accurate. Has famotidine to take as needed. GERD is dietary controlled.   US abdomen Sept 2019 with diffuse fatty liver. AST was 323, ALT 172, bilirubin 1.4 at that time.     Has a daughter who is 62-years-old.    Past Medical History:  Diagnosis Date  . Asthma since birth    no current meds  . GERD (gastroesophageal reflux disease)   . Kidney stone   . Obesity     Past Surgical History:  Procedure Laterality Date  . CESAREAN SECTION N/A 12/07/2014   Procedure: CESAREAN SECTION;  Surgeon:  Catalina Antigua, MD;  Location: [facility];  Service: Obstetrics;  Laterality: N/A;    Current Outpatient Medications  Medication Sig Dispense Refill  . Probiotic Product (PROBIOTIC PO) Take 2 tablets by mouth daily.     No current facility-administered medications for this visit.     Allergies as of 06/08/2018 - Review Complete 06/08/2018  Allergen Reaction Noted  . Peanuts [peanut oil] Swelling 11/14/2013    Family History  Problem Relation Age of Onset  . Hyperlipidemia Mother   . Hypertension Father   . Diabetes Father   . Cancer Maternal Grandfather   . Colon cancer Neg Hx   . Colon polyps Neg Hx     Social History   Socioeconomic History  . Marital status: Single    Spouse name: Not on file  . Number of children: 1  . Years of education: Not on file  . Highest education level: Not on file  Occupational History  . Occupation: Clinical biochemist Rep at [employer]  . Financial resource strain: Not on file  . Food insecurity:    Worry: Not on file    Inability: Not on file  . Transportation needs:    Medical: Not on file    Non-medical: Not on file  Tobacco Use  . Smoking status: Former Smoker    Packs/day: 0.50  Types: Cigarettes    Last attempt to quit: 02/03/2014    Years since quitting: 4.3  . Smokeless tobacco: Never Used  Substance and Sexual Activity  . Alcohol use: No    Comment: none in 3 yrs  . Drug use: Never  . Sexual activity: Not Currently    Birth control/protection: None  Lifestyle  . Physical activity:    Days per week: Not on file    Minutes per session: Not on file  . Stress: Not on file  Relationships  . Social connections:    Talks on phone: Not on file    Gets together: Not on file    Attends religious service: Not on file    Active member of club or organization: Not on file    Attends meetings of clubs or organizations: Not on file    Relationship status: Not on file  . Intimate partner violence:    Fear of current  or ex partner: Not on file    Emotionally abused: Not on file    Physically abused: Not on file    Forced sexual activity: Not on file  Other Topics Concern  . Not on file  Social History Narrative  . Not on file    Review of Systems: Gen: Denies any fever, chills, fatigue, weight loss, lack of appetite.  CV: Denies chest pain, heart palpitations, peripheral edema, syncope.  Resp: Denies shortness of breath at rest or with exertion. Denies wheezing or cough.  GI: see HPI  GU : Denies urinary burning, urinary frequency, urinary hesitancy MS: Denies joint pain, muscle weakness, cramps, or limitation of movement.  Derm: Denies rash, itching, dry skin Psych: Denies depression, anxiety, memory loss, and confusion Heme: Denies bruising, bleeding, and enlarged lymph nodes.  Physical Exam: BP 117/77   Pulse 63   Temp 97.9 F (36.6 C) (Oral)   Ht 5\' 5"  (1.651 m)   Wt 271 lb 3.2 oz (123 kg)   BMI 45.13 kg/m  General:   Alert and oriented. Pleasant and cooperative. Well-nourished and well-developed.  Head:  Normocephalic and atraumatic. Eyes:  Without icterus, sclera clear and conjunctiva pink.  Ears:  Normal auditory acuity. Nose:  No deformity, discharge,  or lesions. Mouth:  No deformity or lesions, oral mucosa pink.  Lungs:  Clear to auscultation bilaterally. No wheezes, rales, or rhonchi. No distress.  Heart:  S1, S2 present without murmurs appreciated.  Abdomen:  +BS, soft, non-tender and non-distended. No HSM noted. No guarding or rebound. No masses appreciated.  Rectal:  Deferred  Msk:  Symmetrical without gross deformities. Normal posture. Extremities:  Without  edema. Neurologic:  Alert and  oriented x4 Skin:  Intact without significant lesions or rashes. Psych:  Alert and cooperative. Normal mood and affect.

## 2018-06-09 NOTE — Progress Notes (Signed)
Misty Stanley, please make appt in 3 months.

## 2018-06-12 ENCOUNTER — Encounter: Payer: Self-pay | Admitting: Gastroenterology

## 2018-06-12 NOTE — Progress Notes (Signed)
SCHEDULED AND LETTER SENT  °

## 2018-09-11 ENCOUNTER — Other Ambulatory Visit: Payer: Self-pay

## 2018-09-11 ENCOUNTER — Telehealth: Payer: Self-pay | Admitting: *Deleted

## 2018-09-11 ENCOUNTER — Ambulatory Visit (INDEPENDENT_AMBULATORY_CARE_PROVIDER_SITE_OTHER): Payer: Self-pay | Admitting: Gastroenterology

## 2018-09-11 ENCOUNTER — Encounter: Payer: Self-pay | Admitting: Gastroenterology

## 2018-09-11 DIAGNOSIS — R109 Unspecified abdominal pain: Secondary | ICD-10-CM

## 2018-09-11 DIAGNOSIS — R1013 Epigastric pain: Secondary | ICD-10-CM

## 2018-09-11 DIAGNOSIS — R740 Nonspecific elevation of levels of transaminase and lactic acid dehydrogenase [LDH]: Secondary | ICD-10-CM

## 2018-09-11 DIAGNOSIS — R748 Abnormal levels of other serum enzymes: Secondary | ICD-10-CM

## 2018-09-11 NOTE — Addendum Note (Signed)
Addended by: Gelene Mink on: 09/11/2018 04:11 PM   Modules accepted: Level of Service

## 2018-09-11 NOTE — Patient Instructions (Addendum)
Please complete blood work at hospital with next episode of abdominal pain.  We are ordering a HIDA in the future.  Will see you back in 4-6 months. Please call if abdominal pain increases in frequency or severity!  I enjoyed seeing you again today! As you know, I value our relationship and want to provide genuine, compassionate, and quality care. I welcome your feedback. If you receive a survey regarding your visit,  I greatly appreciate you taking time to fill this out. See you next time!  Gelene Mink, PhD, ANP-BC [facility]

## 2018-09-11 NOTE — Telephone Encounter (Signed)
Called patient at 1:40pm, 1:53pm, and 2:00pm for facetime visit with AB today and she did not answer. I have left 2 VM's.

## 2018-09-11 NOTE — Progress Notes (Signed)
Primary Care Physician:  Kerri Perches, MD  Primary GI: Dr. Darrick Penna   Virtual Visit via Telephone Note Due to COVID-19, visit is conducted virtually and was requested by patient.   I connected with Jody Warren on 09/11/18 at  2:00 PM EDT by telephone and verified that I am speaking with the correct person using two identifiers.   I discussed the limitations, risks, security and privacy concerns of performing an evaluation and management service by telephone and the availability of in person appointments. I also discussed with the patient that there may be a patient responsible charge related to this service. The patient expressed understanding and agreed to proceed.  Chief Complaint  Patient presents with  . Abdominal Pain    no pain today although the other day she had pain like she did when she went to the ED last year.     History of Present Illness: 33 year old  female with history of epigastric pain in past that improved s/p dietary measures. Gallbladder present. US abdomen Sept 2019 with diffuse fatty liver. AST was 323, ALT 172, bilirubin 1.4 at that time. Repeat HFP in Jan 2020 normal. Consideration for HIDA raised if persistent pain.   Has done well since Jan 2020 until last week. Avoiding fried foods. A week before had another episode of epigastric pain. Was drinking lots of orange juice. Pain came on unprovoked. Could feel pain under her breast and knew it was coming. Radiated to back. Feels like a bad cramp. Took pepto bismol and went away after 10 minutes. Felt nauseated after pepto bismol. Stopped drinking orange juice since last Monday. No typical reflux symptoms. No dysphagia.   Past Medical History:  Diagnosis Date  . Asthma since birth    no current meds  . GERD (gastroesophageal reflux disease)   . Kidney stone   . Obesity      Past Surgical History:  Procedure Laterality Date  . CESAREAN SECTION N/A 12/07/2014   Procedure: CESAREAN SECTION;   Surgeon: Catalina Antigua, MD;  Location: [facility];  Service: Obstetrics;  Laterality: N/A;     Current Meds  Medication Sig  . Probiotic Product (PROBIOTIC PO) Take 2 tablets by mouth daily.       Observations/Objective: No distress. Pleasant and cooperative.   Assessment and Plan: 33 year old female with intermittent epigastric pain without chronic GERD symptoms. Bump in transaminases noted with episode last year with resolution thereafter when rechecked. Thus far, she has had improvement with dietary modification but did have another episode recently.  Will have standing order for HFP and lipase for patient to complete during next episode. Will pursue HIDA scan when able, non-urgent. No need for EGD unless persistent abdominal pain. Return in 4-6 months  Follow Up Instructions: Please complete blood work at hospital with next episode of abdominal pain.  We are ordering a HIDA in the future.  Will see you back in 4-6 months. Please call if abdominal pain increases in frequency or severity!   I discussed the assessment and treatment plan with the patient. The patient was provided an opportunity to ask questions and all were answered. The patient agreed with the plan and demonstrated an understanding of the instructions.   The patient was advised to call back or seek an in-person evaluation if the symptoms worsen or if the condition fails to improve as anticipated.  I provided 10 minutes of face-to-face time during this encounter utilizing video.   Gelene Mink, PhD, ANP-BC [facility]  [facility]astroenter[facility]

## 2018-09-12 ENCOUNTER — Encounter: Payer: Self-pay | Admitting: Gastroenterology

## 2018-09-12 NOTE — Progress Notes (Signed)
cc'ed to pcp °

## 2018-10-09 ENCOUNTER — Telehealth: Payer: Self-pay | Admitting: *Deleted

## 2018-10-09 NOTE — Telephone Encounter (Signed)
HIDA scan scheduled for 5/15 at 8am, arrival time 7:45am, npo after midnight, no pain medications.  Called patient and left detailed message on VM (okay per dpr) regarding appt information. Also provided # to nuc med to r/s.

## 2018-10-12 ENCOUNTER — Telehealth: Payer: Self-pay | Admitting: Gastroenterology

## 2018-10-12 ENCOUNTER — Other Ambulatory Visit (HOSPITAL_COMMUNITY)
Admission: RE | Admit: 2018-10-12 | Discharge: 2018-10-12 | Disposition: A | Discharge status: Home / Self Care | Payer: Self-pay | Source: Ambulatory Visit | Attending: Gastroenterology | Admitting: Gastroenterology

## 2018-10-12 DIAGNOSIS — R109 Unspecified abdominal pain: Secondary | ICD-10-CM | POA: Insufficient documentation

## 2018-10-12 LAB — HEPATIC FUNCTION PANEL
ALT: 21 U/L (ref 0–44)
AST: 25 U/L (ref 15–41)
Albumin: 4 g/dL (ref 3.5–5.0)
Alkaline Phosphatase: 70 U/L (ref 38–126)
Bilirubin, Direct: <0.1 mg/dL (ref 0.0–0.2)
Total Bilirubin: 0.6 mg/dL (ref 0.3–1.2)
Total Protein: 7.8 g/dL (ref 6.5–8.1)

## 2018-10-12 LAB — LIPASE, BLOOD: Lipase: 32 U/L (ref 11–51)

## 2018-10-12 NOTE — Telephone Encounter (Signed)
Lab orders faxed. Lmom, pt is aware that lab orders were faxed to [facility].

## 2018-10-12 NOTE — Telephone Encounter (Signed)
PATIENT CALLED AND SAID SHE SENT YOU A MYCHART MESSAGE, SAID SHE WAS SUPPOSED TO CALL WHEN SHE HAD A BOUT WITH ABDOMINAL PAIN AND SHE SAID SHE DID THIS MORNING.

## 2018-10-12 NOTE — Telephone Encounter (Signed)
Returned [employer].  Jody Warren: labs are already ordered for her to have done at hospital. I just wanted to make sure the hospital had them, because I had asked her to go have labs done with next bout of pain.

## 2018-10-13 ENCOUNTER — Other Ambulatory Visit: Payer: Self-pay

## 2018-10-13 ENCOUNTER — Encounter (HOSPITAL_COMMUNITY): Payer: Self-pay

## 2018-10-13 ENCOUNTER — Ambulatory Visit (HOSPITAL_COMMUNITY)
Admission: RE | Admit: 2018-10-13 | Discharge: 2018-10-13 | Disposition: A | Discharge status: Home / Self Care | Payer: Self-pay | Source: Ambulatory Visit | Attending: Gastroenterology | Admitting: Gastroenterology

## 2018-10-13 DIAGNOSIS — R1013 Epigastric pain: Secondary | ICD-10-CM | POA: Insufficient documentation

## 2018-10-13 DIAGNOSIS — R748 Abnormal levels of other serum enzymes: Secondary | ICD-10-CM | POA: Insufficient documentation

## 2018-10-13 MED ORDER — TECHNETIUM TC 99M MEBROFENIN IV KIT
5.0000 | PACK | Freq: Once | INTRAVENOUS | Status: AC | PRN
  Administered 2018-10-13: 08:00:00 5.3 via INTRAVENOUS

## 2018-10-16 ENCOUNTER — Other Ambulatory Visit: Payer: Self-pay | Admitting: *Deleted

## 2018-10-16 DIAGNOSIS — K828 Other specified diseases of gallbladder: Secondary | ICD-10-CM

## 2018-10-16 NOTE — Progress Notes (Signed)
[facility] clinical pool. Please refer patient to General Surgery due to biliary dyskinesia. I asked if she had a preference, but I haven't heard back yet. We will just get the ball rolling.

## 2018-11-09 ENCOUNTER — Encounter: Payer: Self-pay | Admitting: General Surgery

## 2018-11-09 ENCOUNTER — Other Ambulatory Visit: Payer: Self-pay

## 2018-11-09 ENCOUNTER — Ambulatory Visit (INDEPENDENT_AMBULATORY_CARE_PROVIDER_SITE_OTHER): Payer: Self-pay | Admitting: General Surgery

## 2018-11-09 DIAGNOSIS — K811 Chronic cholecystitis: Secondary | ICD-10-CM | POA: Insufficient documentation

## 2018-11-09 NOTE — Patient Instructions (Signed)
Laparoscopic Cholecystectomy Laparoscopic cholecystectomy is surgery to remove the gallbladder. The gallbladder is a pear-shaped organ that lies beneath the liver on the right side of the body. The gallbladder stores bile, which is a fluid that helps the body to digest fats. Cholecystectomy is often done for inflammation of the gallbladder (cholecystitis). This condition is usually caused by a buildup of gallstones (cholelithiasis) in the gallbladder. Gallstones can block the flow of bile, which can result in inflammation and pain. In severe cases, emergency surgery may be required. This procedure is done though small incisions in your abdomen (laparoscopic surgery). A thin scope with a camera (laparoscope) is inserted through one incision. Thin surgical instruments are inserted through the other incisions. In some cases, a laparoscopic procedure may be turned into a type of surgery that is done through a larger incision (open surgery). Tell a health care provider about:  Any allergies you have.  All medicines you are taking, including vitamins, herbs, eye drops, creams, and over-the-counter medicines.  Any problems you or family members have had with anesthetic medicines.  Any blood disorders you have.  Any surgeries you have had.  Any medical conditions you have.  Whether you are pregnant or may be pregnant. What are the risks? Generally, this is a safe procedure. However, problems may occur, including:  Infection.  Bleeding.  Allergic reactions to medicines.  Damage to other structures or organs.  A stone remaining in the common bile duct. The common bile duct carries bile from the gallbladder into the small intestine.  A bile leak from the cyst duct that is clipped when your gallbladder is removed. What happens before the procedure?   Medicines  Ask your health care provider about: ? Changing or stopping your regular medicines. This is especially important if you are taking  diabetes medicines or blood thinners. ? Taking medicines such as aspirin and ibuprofen. These medicines can thin your blood. Do not take these medicines before your procedure if your health care provider instructs you not to.  You may be given antibiotic medicine to help prevent infection. General instructions  Let your health care provider know if you develop a cold or an infection before surgery.  Plan to have someone take you home from the hospital or clinic.  Ask your health care provider how your surgical site will be marked or identified. What happens during the procedure?   To reduce your risk of infection: ? Your health care team will wash or sanitize their hands. ? Your skin will be washed with soap. ? Hair may be removed from the surgical area.  An IV tube may be inserted into one of your veins.  You will be given one or more of the following: ? A medicine to help you relax (sedative). ? A medicine to make you fall asleep (general anesthetic).  A breathing tube will be placed in your mouth.  Your surgeon will make several small cuts (incisions) in your abdomen.  The laparoscope will be inserted through one of the small incisions. The camera on the laparoscope will send images to a TV screen (monitor) in the operating room. This lets your surgeon see inside your abdomen.  Air-like gas will be pumped into your abdomen. This will expand your abdomen to give the surgeon more room to perform the surgery.  Other tools that are needed for the procedure will be inserted through the other incisions. The gallbladder will be removed through one of the incisions.  Your common bile duct   may be examined. If stones are found in the common bile duct, they may be removed.  After your gallbladder has been removed, the incisions will be closed with stitches (sutures), staples, or skin glue.  Your incisions may be covered with a bandage (dressing). The procedure may vary among health  care providers and hospitals. What happens after the procedure?  Your blood pressure, heart rate, breathing rate, and blood oxygen level will be monitored until the medicines you were given have worn off.  You will be given medicines as needed to control your pain.  Do not drive for 24 hours if you were given a sedative. This information is not intended to replace advice given to you by your health care provider. Make sure you discuss any questions you have with your health care provider. Document Released: 05/17/2005 Document Revised: 04/14/2017 Document Reviewed: 11/03/2015 Elsevier Interactive Patient Education  2019 [employer]  

## 2018-11-09 NOTE — H&P (Signed)
Jody Warren; [id number]; 04/01/1986   HPI Patient is a 33 year old black female who was referred to my care by Roseanne Kaufman for evaluation treatment of biliary colic.  Patient has had intermittent episodes of upper abdominal pain, back pain, bloating, nausea, and vomiting especially after fatty meal over the past 8 months.  But has somewhat increased in frequency recently.  She has 0 out of 10 abdominal pain today.  She denies any fever, chills, jaundice.  Ultrasound the gallbladder was negative for cholelithiasis.  HIDA scan revealed a low gallbladder ejection fraction. Past Medical History:  Diagnosis Date  . Asthma since birth    no current meds  . GERD (gastroesophageal reflux disease)   . Kidney stone   . Obesity     Past Surgical History:  Procedure Laterality Date  . CESAREAN SECTION N/A 12/07/2014   Procedure: CESAREAN SECTION;  Surgeon: Mora Bellman, MD;  Location: [facility];  Service: Obstetrics;  Laterality: N/A;    Family History  Problem Relation Age of Onset  . Hyperlipidemia Mother   . Hypertension Father   . Diabetes Father   . Cancer Maternal Grandfather   . Colon cancer Neg Hx   . Colon polyps Neg Hx     Current Outpatient Medications on File Prior to Visit  Medication Sig Dispense Refill  . naproxen sodium (ALEVE) 220 MG tablet Take 220 mg by mouth daily as needed.    . Probiotic Product (PROBIOTIC PO) Take 2 tablets by mouth daily.     No current facility-administered medications on file prior to visit.     Allergies  Allergen Reactions  . Peanuts [Peanut Oil] Swelling    Social History   Substance and Sexual Activity  Alcohol Use No   Comment: none in 3 yrs    Social History   Tobacco Use  Smoking Status Former Smoker  . Packs/day: 0.50  . Types: Cigarettes  . Quit date: 02/03/2014  . Years since quitting: 4.7  Smokeless Tobacco Never Used    Review of Systems  Constitutional: Negative.   HENT: Negative.   Eyes: Negative.    Respiratory: Negative.   Cardiovascular: Negative.   Gastrointestinal: Positive for heartburn and nausea. Negative for abdominal pain.  Genitourinary: Negative.   Musculoskeletal: Negative.   Skin: Negative.   Neurological: Negative.   Endo/Heme/Allergies: Negative.   Psychiatric/Behavioral: Negative.     Objective   Vitals:   11/09/18 1022  BP: 137/79  Pulse: 64  Resp: 16  Temp: 98.2 F (36.8 C)  SpO2: 96%    Physical Exam Vitals signs reviewed.  Constitutional:      Appearance: Normal appearance. She is obese. She is not ill-appearing.  HENT:     Head: Normocephalic and atraumatic.  Eyes:     General: No scleral icterus. Cardiovascular:     Rate and Rhythm: Normal rate and regular rhythm.     Heart sounds: Normal heart sounds. No murmur. No friction rub. No gallop.   Pulmonary:     Effort: Pulmonary effort is normal. No respiratory distress.     Breath sounds: Normal breath sounds. No stridor. No wheezing, rhonchi or rales.  Abdominal:     General: Bowel sounds are normal. There is no distension.     Palpations: Abdomen is soft. There is no mass.     Tenderness: There is no abdominal tenderness. There is no guarding or rebound.     Hernia: No hernia is present.  Skin:    General: Skin is  warm and dry.  Neurological:     Mental Status: She is alert and oriented to person, place, and time.   Ultrasound and HIDA scan reports reviewed  Assessment  Chronic cholecystitis Plan   Patient is scheduled for laparoscopic cholecystectomy on 11/17/2018.  The risks and benefits of the procedure including bleeding, infection, hepatobiliary injury, and the possibility of an open procedure were fully explained to the patient, who gave informed consent.

## 2018-11-09 NOTE — Progress Notes (Signed)
Jody Warren; [id number]; 04/01/1986   HPI Patient is a 33 year old black female who was referred to my care by Roseanne Kaufman for evaluation treatment of biliary colic.  Patient has had intermittent episodes of upper abdominal pain, back pain, bloating, nausea, and vomiting especially after fatty meal over the past 8 months.  But has somewhat increased in frequency recently.  She has 0 out of 10 abdominal pain today.  She denies any fever, chills, jaundice.  Ultrasound the gallbladder was negative for cholelithiasis.  HIDA scan revealed a low gallbladder ejection fraction. Past Medical History:  Diagnosis Date  . Asthma since birth    no current meds  . GERD (gastroesophageal reflux disease)   . Kidney stone   . Obesity     Past Surgical History:  Procedure Laterality Date  . CESAREAN SECTION N/A 12/07/2014   Procedure: CESAREAN SECTION;  Surgeon: Mora Bellman, MD;  Location: [facility];  Service: Obstetrics;  Laterality: N/A;    Family History  Problem Relation Age of Onset  . Hyperlipidemia Mother   . Hypertension Father   . Diabetes Father   . Cancer Maternal Grandfather   . Colon cancer Neg Hx   . Colon polyps Neg Hx     Current Outpatient Medications on File Prior to Visit  Medication Sig Dispense Refill  . naproxen sodium (ALEVE) 220 MG tablet Take 220 mg by mouth daily as needed.    . Probiotic Product (PROBIOTIC PO) Take 2 tablets by mouth daily.     No current facility-administered medications on file prior to visit.     Allergies  Allergen Reactions  . Peanuts [Peanut Oil] Swelling    Social History   Substance and Sexual Activity  Alcohol Use No   Comment: none in 3 yrs    Social History   Tobacco Use  Smoking Status Former Smoker  . Packs/day: 0.50  . Types: Cigarettes  . Quit date: 02/03/2014  . Years since quitting: 4.7  Smokeless Tobacco Never Used    Review of Systems  Constitutional: Negative.   HENT: Negative.   Eyes: Negative.    Respiratory: Negative.   Cardiovascular: Negative.   Gastrointestinal: Positive for heartburn and nausea. Negative for abdominal pain.  Genitourinary: Negative.   Musculoskeletal: Negative.   Skin: Negative.   Neurological: Negative.   Endo/Heme/Allergies: Negative.   Psychiatric/Behavioral: Negative.     Objective   Vitals:   11/09/18 1022  BP: 137/79  Pulse: 64  Resp: 16  Temp: 98.2 F (36.8 C)  SpO2: 96%    Physical Exam Vitals signs reviewed.  Constitutional:      Appearance: Normal appearance. She is obese. She is not ill-appearing.  HENT:     Head: Normocephalic and atraumatic.  Eyes:     General: No scleral icterus. Cardiovascular:     Rate and Rhythm: Normal rate and regular rhythm.     Heart sounds: Normal heart sounds. No murmur. No friction rub. No gallop.   Pulmonary:     Effort: Pulmonary effort is normal. No respiratory distress.     Breath sounds: Normal breath sounds. No stridor. No wheezing, rhonchi or rales.  Abdominal:     General: Bowel sounds are normal. There is no distension.     Palpations: Abdomen is soft. There is no mass.     Tenderness: There is no abdominal tenderness. There is no guarding or rebound.     Hernia: No hernia is present.  Skin:    General: Skin is  warm and dry.  Neurological:     Mental Status: She is alert and oriented to person, place, and time.   Ultrasound and HIDA scan reports reviewed  Assessment  Chronic cholecystitis Plan   Patient is scheduled for laparoscopic cholecystectomy on 11/17/2018.  The risks and benefits of the procedure including bleeding, infection, hepatobiliary injury, and the possibility of an open procedure were fully explained to the patient, who gave informed consent.  

## 2018-11-10 NOTE — Patient Instructions (Signed)
Jody Warren  11/10/2018     @PREFPERIOPPHARMACY @   Your procedure is scheduled on  11/17/2018.  Report to [facility] at  615  A.M.  Call this number if you have problems the morning of surgery:  [phone]   Remember:  Do not eat or drink after midnight.                        Take these medicines the morning of surgery with A SIP OF WATER  None    Do not wear jewelry, make-up or nail polish.  Do not wear lotions, powders, or perfumes, or deodorant.  Do not shave 48 hours prior to surgery.  Men may shave face and neck.  Do not bring valuables to the hospital.  [facility] is not responsible for any belongings or valuables.  Contacts, dentures or bridgework may not be worn into surgery.  Leave your suitcase in the car.  After surgery it may be brought to your room.  For patients admitted to the hospital, discharge time will be determined by your treatment team.  Patients discharged the day of surgery will not be allowed to drive home.   Name and phone number of your driver:   family Special instructions:  None  Please read over the following fact sheets that you were given. Anesthesia Post-op Instructions and Care and Recovery After Surgery       Laparoscopic Cholecystectomy, Care After This sheet gives you information about how to care for yourself after your procedure. Your health care provider may also give you more specific instructions. If you have problems or questions, contact your health care provider. What can I expect after the procedure? After the procedure, it is common to have:  Pain at your incision sites. You will be given medicines to control this pain.  Mild nausea or vomiting.  Bloating and possible shoulder pain from the air-like gas that was used during the procedure. Follow these instructions at home: Incision care   Follow instructions from your health care provider about how to take care of your incisions. Make sure you:  ? Wash your hands with soap and water before you change your bandage (dressing). If soap and water are not available, use hand sanitizer. ? Change your dressing as told by your health care provider. ? Leave stitches (sutures), skin glue, or adhesive strips in place. These skin closures may need to be in place for 2 weeks or longer. If adhesive strip edges start to loosen and curl up, you may trim the loose edges. Do not remove adhesive strips completely unless your health care provider tells you to do that.  Do not take baths, swim, or use a hot tub until your health care provider approves. Ask your health care provider if you can take showers. You may only be allowed to take sponge baths for bathing.  Check your incision area every day for signs of infection. Check for: ? More redness, swelling, or pain. ? More fluid or blood. ? Warmth. ? Pus or a bad smell. Activity  Do not drive or use heavy machinery while taking prescription pain medicine.  Do not lift anything that is heavier than 10 lb (4.5 kg) until your health care provider approves.  Do not play contact sports until your health care provider approves.  Do not drive for 24 hours if you were given a medicine to help you relax (sedative).  Rest as needed. Do not return to work or school until your health care provider approves. General instructions  Take over-the-counter and prescription medicines only as told by your health care provider.  To prevent or treat constipation while you are taking prescription pain medicine, your health care provider may recommend that you: ? Drink enough fluid to keep your urine clear or pale yellow. ? Take over-the-counter or prescription medicines. ? Eat foods that are high in fiber, such as fresh fruits and vegetables, whole grains, and beans. ? Limit foods that are high in fat and processed sugars, such as fried and sweet foods. Contact a health care provider if:  You develop a rash.  You  have more redness, swelling, or pain around your incisions.  You have more fluid or blood coming from your incisions.  Your incisions feel warm to the touch.  You have pus or a bad smell coming from your incisions.  You have a fever.  One or more of your incisions breaks open. Get help right away if:  You have trouble breathing.  You have chest pain.  You have increasing pain in your shoulders.  You faint or feel dizzy when you stand.  You have severe pain in your abdomen.  You have nausea or vomiting that lasts for more than one day.  You have leg pain. This information is not intended to replace advice given to you by your health care provider. Make sure you discuss any questions you have with your health care provider. Document Released: 05/17/2005 Document Revised: 12/06/2015 Document Reviewed: 11/03/2015 Elsevier Interactive Patient Education  2019 [employer] Anesthesia, Adult, Care After This sheet gives you information about how to care for yourself after your procedure. Your health care provider may also give you more specific instructions. If you have problems or questions, contact your health care provider. What can I expect after the procedure? After the procedure, the following side effects are common:  Pain or discomfort at the IV site.  Nausea.  Vomiting.  Sore throat.  Trouble concentrating.  Feeling cold or chills.  Weak or tired.  Sleepiness and fatigue.  Soreness and body aches. These side effects can affect parts of the body that were not involved in surgery. Follow these instructions at home:  For at least 24 hours after the procedure:  Have a responsible adult stay with you. It is important to have someone help care for you until you are awake and alert.  Rest as needed.  Do not: ? Participate in activities in which you could fall or become injured. ? Drive. ? Use heavy machinery. ? Drink alcohol. ? Take sleeping pills  or medicines that cause drowsiness. ? Make important decisions or sign legal documents. ? Take care of children on your own. Eating and drinking  Follow any instructions from your health care provider about eating or drinking restrictions.  When you feel hungry, start by eating small amounts of foods that are soft and easy to digest (bland), such as toast. Gradually return to your regular diet.  Drink enough fluid to keep your urine pale yellow.  If you vomit, rehydrate by drinking water, juice, or clear broth. General instructions  If you have sleep apnea, surgery and certain medicines can increase your risk for breathing problems. Follow instructions from your health care provider about wearing your sleep device: ? Anytime you are sleeping, including during daytime naps. ? While taking prescription pain medicines, sleeping medicines, or medicines that make you  drowsy.  Return to your normal activities as told by your health care provider. Ask your health care provider what activities are safe for you.  Take over-the-counter and prescription medicines only as told by your health care provider.  If you smoke, do not smoke without supervision.  Keep all follow-up visits as told by your health care provider. This is important. Contact a health care provider if:  You have nausea or vomiting that does not get better with medicine.  You cannot eat or drink without vomiting.  You have pain that does not get better with medicine.  You are unable to pass urine.  You develop a skin rash.  You have a fever.  You have redness around your IV site that gets worse. Get help right away if:  You have difficulty breathing.  You have chest pain.  You have blood in your urine or stool, or you vomit blood. Summary  After the procedure, it is common to have a sore throat or nausea. It is also common to feel tired.  Have a responsible adult stay with you for the first 24 hours after  general anesthesia. It is important to have someone help care for you until you are awake and alert.  When you feel hungry, start by eating small amounts of foods that are soft and easy to digest (bland), such as toast. Gradually return to your regular diet.  Drink enough fluid to keep your urine pale yellow.  Return to your normal activities as told by your health care provider. Ask your health care provider what activities are safe for you. This information is not intended to replace advice given to you by your health care provider. Make sure you discuss any questions you have with your health care provider. Document Released: 08/23/2000 Document Revised: 12/31/2016 Document Reviewed: 12/31/2016 Elsevier Interactive Patient Education  2019 [employer] Inc[employer] How to Use Chlorhexidine Before Surgery Chlorhexidine gluconate (CHG) is a germ-killing (antiseptic) solution that is used to clean the skin. It gets rid of the bacteria that normally live on the skin. Cleaning your skin with CHG before surgery helps lower the risk for infection after surgery. To clean your skin before surgery, you may be given:  A CHG solution to use in the shower.  A prepackaged cloth that contains CHG. What are the risks? Risks of using CHG include:  A skin reaction.  Hearing loss, if CHG gets in your ears.  Eye injury, if CHG gets in your eyes and is not rinsed out.  The CHG product catching fire. Make sure that you avoid smoking and flames after applying CHG to your skin. Do not use CHG:  If you have a chlorhexidine allergy or have previously reacted to chlorhexidine.  On babies younger than 512 months of age. How to use CHG solution   Use CHG only as told by your health care provider, and follow the instructions on the label.  Use CHG solution while taking a shower. Follow these steps when using CHG solution (unless your health care provider gives you different instructions): 1. Start the shower. 2. Use  your normal soap and shampoo to wash your face and hair. 3. Turn off the shower or move out of the shower stream. 4. Pour the CHG onto a clean washcloth. Do not use any type of brush or rough-edged sponge. 5. Starting at your neck, lather your body down to your toes. Make sure you:  Pay special attention to the part of your body where  you will be having surgery. Scrub this area for at least 1 minute.  Use the full amount of CHG as directed. Usually, this is one bottle.  Do not use CHG on your head or face. If the solution gets into your ears or eyes, rinse them well with water.  Avoid your genital area.  Avoid any areas of skin that have broken skin, cuts, or scrapes.  Scrub your back and under your arms. Make sure to wash skin folds. 6. Let the lather sit on your skin for 1-2 minutes or as long as told by your health care provider. 7. Thoroughly rinse your entire body in the shower. Make sure that all body creases and crevices are rinsed well. 8. Dry off with a clean towel. Do not put any substances on your body afterward, such as powder, lotion, or perfume. 9. Put on clean clothes or pajamas. 10. If it is the night before your surgery, sleep in clean sheets. How to use CHG prepackaged cloths   Only use CHG cloths as told by your health care provider, and follow the instructions on the label.  Use the CHG cloth on clean, dry skin. Follow these steps when using a CHG cloth (unless your health care provider gives you different instructions): 1. Using the CHG cloth, vigorously scrub the part of your body where you will be having surgery. Scrub using a back-and-forth motion for 3 minutes. The area on your body should be completely wet with CHG when you are done scrubbing. 2. Do not rinse. Discard the cloth and let the area air-dry for 1 minute. Do not put any substances on your body afterward, such as powder, lotion, or perfume. 3. Put on clean clothes or pajamas. 4. If it is the night  before your surgery, sleep in clean sheets. Contact a health care provider if:  Your skin gets irritated after scrubbing.  You have questions about using your solution or cloth. Get help right away if:  Your eyes become very red or swollen.  Your eyes itch badly.  Your skin itches badly and is red or swollen.  Your hearing changes.  You have trouble seeing.  You have swelling or tingling in your mouth or throat.  You have trouble breathing.  You swallow any chlorhexidine. Summary  Chlorhexidine gluconate (CHG) is a germ-killing (antiseptic) solution that is used to clean the skin. Cleaning your skin with CHG before surgery helps lower the risk for infection after surgery.  You may be given CHG to use at home. It may be in a bottle or in a prepackaged cloth to use on your skin. Carefully follow your health care provider's instructions and the instructions on the product label.  Do not use CHG if you have a chlorhexidine allergy.  Contact your health care provider if your skin gets irritated after scrubbing. This information is not intended to replace advice given to you by your health care provider. Make sure you discuss any questions you have with your health care provider. Document Released: 02/09/2012 Document Revised: 04/14/2017 Document Reviewed: 04/14/2017 Elsevier Interactive Patient Education  2019 [employer]

## 2018-11-14 ENCOUNTER — Encounter (HOSPITAL_COMMUNITY): Payer: Self-pay

## 2018-11-14 ENCOUNTER — Encounter (HOSPITAL_COMMUNITY)
Admission: RE | Admit: 2018-11-14 | Discharge: 2018-11-14 | Disposition: A | Discharge status: Home / Self Care | Payer: Self-pay | Source: Ambulatory Visit | Attending: General Surgery | Admitting: General Surgery

## 2018-11-14 ENCOUNTER — Other Ambulatory Visit: Payer: Self-pay

## 2018-11-14 ENCOUNTER — Other Ambulatory Visit (HOSPITAL_COMMUNITY)
Admission: RE | Admit: 2018-11-14 | Discharge: 2018-11-14 | Disposition: A | Discharge status: Home / Self Care | Payer: HRSA Program | Source: Ambulatory Visit | Attending: General Surgery | Admitting: General Surgery

## 2018-11-14 DIAGNOSIS — Z1159 Encounter for screening for other viral diseases: Secondary | ICD-10-CM | POA: Diagnosis present

## 2018-11-14 DIAGNOSIS — Z01812 Encounter for preprocedural laboratory examination: Secondary | ICD-10-CM | POA: Insufficient documentation

## 2018-11-14 HISTORY — DX: Personal history of urinary calculi: Z87.442

## 2018-11-14 LAB — CBC WITH DIFFERENTIAL/PLATELET
Abs Immature Granulocytes: 0.02 10*3/uL (ref 0.00–0.07)
Basophils Absolute: 0 10*3/uL (ref 0.0–0.1)
Basophils Relative: 0 %
Eosinophils Absolute: 0.9 10*3/uL — ABNORMAL HIGH (ref 0.0–0.5)
Eosinophils Relative: 10 %
HCT: 44.3 % (ref 36.0–46.0)
Hemoglobin: 14.4 g/dL (ref 12.0–15.0)
Immature Granulocytes: 0 %
Lymphocytes Relative: 43 %
Lymphs Abs: 4 10*3/uL (ref 0.7–4.0)
MCH: 28.7 pg (ref 26.0–34.0)
MCHC: 32.5 g/dL (ref 30.0–36.0)
MCV: 88.2 fL (ref 80.0–100.0)
Monocytes Absolute: 0.7 10*3/uL (ref 0.1–1.0)
Monocytes Relative: 8 %
Neutro Abs: 3.6 10*3/uL (ref 1.7–7.7)
Neutrophils Relative %: 39 %
Platelets: 333 10*3/uL (ref 150–400)
RBC: 5.02 MIL/uL (ref 3.87–5.11)
RDW: 13.7 % (ref 11.5–15.5)
WBC: 9.2 10*3/uL (ref 4.0–10.5)
nRBC: 0 % (ref 0.0–0.2)

## 2018-11-14 LAB — COMPREHENSIVE METABOLIC PANEL
ALT: 15 U/L (ref 0–44)
AST: 18 U/L (ref 15–41)
Albumin: 3.9 g/dL (ref 3.5–5.0)
Alkaline Phosphatase: 66 U/L (ref 38–126)
Anion gap: 7 (ref 5–15)
BUN: 15 mg/dL (ref 6–20)
CO2: 24 mmol/L (ref 22–32)
Calcium: 9.1 mg/dL (ref 8.9–10.3)
Chloride: 107 mmol/L (ref 98–111)
Creatinine, Ser: 0.76 mg/dL (ref 0.44–1.00)
GFR calc Af Amer: >60 mL/min (ref >60)
GFR calc non Af Amer: >60 mL/min (ref >60)
Glucose, Bld: 83 mg/dL (ref 70–99)
Potassium: 3.9 mmol/L (ref 3.5–5.1)
Sodium: 138 mmol/L (ref 135–145)
Total Bilirubin: 0.6 mg/dL (ref 0.3–1.2)
Total Protein: 7.4 g/dL (ref 6.5–8.1)

## 2018-11-14 LAB — HCG, SERUM, QUALITATIVE: Preg, Serum: NEGATIVE

## 2018-11-15 LAB — NOVEL CORONAVIRUS, NAA (HOSP ORDER, SEND-OUT TO REF LAB; TAT 18-24 HRS): SARS-CoV-2, NAA: NOT DETECTED

## 2018-11-17 ENCOUNTER — Encounter (HOSPITAL_COMMUNITY): Payer: Self-pay | Admitting: Anesthesiology

## 2018-11-17 ENCOUNTER — Ambulatory Visit (HOSPITAL_COMMUNITY): Payer: Self-pay | Admitting: Anesthesiology

## 2018-11-17 ENCOUNTER — Encounter (HOSPITAL_COMMUNITY)
Admission: RE | Disposition: A | Discharge status: Home / Self Care | Payer: Self-pay | Source: Home / Self Care | Attending: General Surgery

## 2018-11-17 ENCOUNTER — Other Ambulatory Visit: Payer: Self-pay

## 2018-11-17 ENCOUNTER — Ambulatory Visit (HOSPITAL_COMMUNITY)
Admission: RE | Admit: 2018-11-17 | Discharge: 2018-11-17 | Disposition: A | Discharge status: Home / Self Care | Payer: Self-pay | Attending: General Surgery | Admitting: General Surgery

## 2018-11-17 DIAGNOSIS — F17210 Nicotine dependence, cigarettes, uncomplicated: Secondary | ICD-10-CM | POA: Insufficient documentation

## 2018-11-17 DIAGNOSIS — Z6841 Body Mass Index (BMI) 40.0 and over, adult: Secondary | ICD-10-CM | POA: Insufficient documentation

## 2018-11-17 DIAGNOSIS — K219 Gastro-esophageal reflux disease without esophagitis: Secondary | ICD-10-CM | POA: Insufficient documentation

## 2018-11-17 DIAGNOSIS — K811 Chronic cholecystitis: Secondary | ICD-10-CM

## 2018-11-17 DIAGNOSIS — K8010 Calculus of gallbladder with chronic cholecystitis without obstruction: Secondary | ICD-10-CM | POA: Insufficient documentation

## 2018-11-17 HISTORY — PX: CHOLECYSTECTOMY: SHX55

## 2018-11-17 SURGERY — LAPAROSCOPIC CHOLECYSTECTOMY
Anesthesia: General | Site: Abdomen

## 2018-11-17 MED ORDER — PROPOFOL 10 MG/ML IV BOLUS
INTRAVENOUS | Status: AC
  Filled 2018-11-17: qty 40

## 2018-11-17 MED ORDER — KETOROLAC TROMETHAMINE 30 MG/ML IJ SOLN
30.0000 mg | Freq: Once | INTRAMUSCULAR | Status: AC
  Administered 2018-11-17: 30 mg via INTRAVENOUS
  Filled 2018-11-17: qty 1

## 2018-11-17 MED ORDER — 0.9 % SODIUM CHLORIDE (POUR BTL) OPTIME
TOPICAL | Status: DC | PRN
  Administered 2018-11-17: 08:00:00 1000 mL

## 2018-11-17 MED ORDER — ROCURONIUM BROMIDE 50 MG/5ML IV SOSY
PREFILLED_SYRINGE | INTRAVENOUS | Status: DC | PRN
  Administered 2018-11-17: 10 mg via INTRAVENOUS
  Administered 2018-11-17: 25 mg via INTRAVENOUS

## 2018-11-17 MED ORDER — BUPIVACAINE LIPOSOME 1.3 % IJ SUSP
INTRAMUSCULAR | Status: AC
  Filled 2018-11-17: qty 20

## 2018-11-17 MED ORDER — BUPIVACAINE LIPOSOME 1.3 % IJ SUSP
INTRAMUSCULAR | Status: DC | PRN
  Administered 2018-11-17: 20 mL

## 2018-11-17 MED ORDER — HEMOSTATIC AGENTS (NO CHARGE) OPTIME
TOPICAL | Status: DC | PRN
  Administered 2018-11-17: 1 via TOPICAL

## 2018-11-17 MED ORDER — SODIUM CHLORIDE 0.9% FLUSH
INTRAVENOUS | Status: AC
  Filled 2018-11-17: qty 60

## 2018-11-17 MED ORDER — LIDOCAINE 2% (20 MG/ML) 5 ML SYRINGE
INTRAMUSCULAR | Status: DC | PRN
  Administered 2018-11-17: 40 mg via INTRAVENOUS

## 2018-11-17 MED ORDER — FENTANYL CITRATE (PF) 100 MCG/2ML IJ SOLN
INTRAMUSCULAR | Status: DC | PRN
  Administered 2018-11-17 (×2): 50 ug via INTRAVENOUS
  Administered 2018-11-17: 25 ug via INTRAVENOUS
  Administered 2018-11-17 (×3): 50 ug via INTRAVENOUS

## 2018-11-17 MED ORDER — DEXAMETHASONE SODIUM PHOSPHATE 4 MG/ML IJ SOLN
INTRAMUSCULAR | Status: DC | PRN
  Administered 2018-11-17: 8 mg via INTRAVENOUS

## 2018-11-17 MED ORDER — GLYCOPYRROLATE PF 0.2 MG/ML IJ SOSY
PREFILLED_SYRINGE | INTRAMUSCULAR | Status: DC | PRN
  Administered 2018-11-17: .2 mg via INTRAVENOUS

## 2018-11-17 MED ORDER — LACTATED RINGERS IV SOLN
INTRAVENOUS | Status: DC
  Administered 2018-11-17: 07:00:00 via INTRAVENOUS

## 2018-11-17 MED ORDER — ROCURONIUM BROMIDE 10 MG/ML (PF) SYRINGE
PREFILLED_SYRINGE | INTRAVENOUS | Status: AC
  Filled 2018-11-17: qty 10

## 2018-11-17 MED ORDER — ONDANSETRON HCL 4 MG/2ML IJ SOLN
INTRAMUSCULAR | Status: DC | PRN
  Administered 2018-11-17: 4 mg via INTRAVENOUS

## 2018-11-17 MED ORDER — CEFAZOLIN SODIUM-DEXTROSE 1-4 GM/50ML-% IV SOLN
INTRAVENOUS | Status: AC
  Filled 2018-11-17: qty 50

## 2018-11-17 MED ORDER — FENTANYL CITRATE (PF) 100 MCG/2ML IJ SOLN
INTRAMUSCULAR | Status: AC
  Filled 2018-11-17: qty 2

## 2018-11-17 MED ORDER — HYDROCODONE-ACETAMINOPHEN 7.5-325 MG PO TABS
1.0000 | ORAL_TABLET | Freq: Once | ORAL | Status: AC | PRN
  Administered 2018-11-17: 1 via ORAL
  Filled 2018-11-17: qty 1

## 2018-11-17 MED ORDER — CHLORHEXIDINE GLUCONATE CLOTH 2 % EX PADS: 6.0000 | MEDICATED_PAD | Freq: Once | CUTANEOUS | Status: DC

## 2018-11-17 MED ORDER — SUCCINYLCHOLINE CHLORIDE 20 MG/ML IJ SOLN
INTRAMUSCULAR | Status: DC | PRN
  Administered 2018-11-17: 180 mg via INTRAVENOUS

## 2018-11-17 MED ORDER — LIDOCAINE 2% (20 MG/ML) 5 ML SYRINGE
INTRAMUSCULAR | Status: AC
  Filled 2018-11-17: qty 5

## 2018-11-17 MED ORDER — GLYCOPYRROLATE PF 0.2 MG/ML IJ SOSY
PREFILLED_SYRINGE | INTRAMUSCULAR | Status: AC
  Filled 2018-11-17: qty 1

## 2018-11-17 MED ORDER — MIDAZOLAM HCL 2 MG/2ML IJ SOLN: 0.5000 mg | Freq: Once | INTRAMUSCULAR | Status: DC | PRN

## 2018-11-17 MED ORDER — PROMETHAZINE HCL 25 MG/ML IJ SOLN
6.2500 mg | INTRAMUSCULAR | Status: DC | PRN
  Administered 2018-11-17: 6.25 mg via INTRAVENOUS
  Filled 2018-11-17: qty 1

## 2018-11-17 MED ORDER — HYDROCODONE-ACETAMINOPHEN 5-325 MG PO TABS: 1.0000 | ORAL_TABLET | ORAL | 0 refills | Status: DC | PRN

## 2018-11-17 MED ORDER — HYDROMORPHONE HCL 1 MG/ML IJ SOLN
0.2500 mg | INTRAMUSCULAR | Status: DC | PRN
  Administered 2018-11-17 (×2): 0.5 mg via INTRAVENOUS
  Filled 2018-11-17 (×2): qty 0.5

## 2018-11-17 MED ORDER — SUCCINYLCHOLINE CHLORIDE 200 MG/10ML IV SOSY
PREFILLED_SYRINGE | INTRAVENOUS | Status: AC
  Filled 2018-11-17: qty 20

## 2018-11-17 MED ORDER — CEFAZOLIN SODIUM-DEXTROSE 2-4 GM/100ML-% IV SOLN
INTRAVENOUS | Status: AC
  Filled 2018-11-17: qty 100

## 2018-11-17 MED ORDER — PROPOFOL 10 MG/ML IV BOLUS
INTRAVENOUS | Status: DC | PRN
  Administered 2018-11-17: 200 mg via INTRAVENOUS

## 2018-11-17 MED ORDER — DEXTROSE 5 % IV SOLN
3.0000 g | INTRAVENOUS | Status: AC
  Administered 2018-11-17: 08:00:00 3 g via INTRAVENOUS

## 2018-11-17 MED ORDER — SUGAMMADEX SODIUM 500 MG/5ML IV SOLN
INTRAVENOUS | Status: AC
  Filled 2018-11-17: qty 5

## 2018-11-17 MED ORDER — FENTANYL CITRATE (PF) 250 MCG/5ML IJ SOLN
INTRAMUSCULAR | Status: AC
  Filled 2018-11-17: qty 5

## 2018-11-17 SURGICAL SUPPLY — 41 items
APPLIER CLIP ROT 10 11.4 M/L (STAPLE) ×2
BAG RETRIEVAL 10 (BASKET) ×1
CHLORAPREP W/TINT 26 (MISCELLANEOUS) ×2 IMPLANT
CLIP APPLIE ROT 10 11.4 M/L (STAPLE) ×1 IMPLANT
CLOTH BEACON ORANGE TIMEOUT ST (SAFETY) ×2 IMPLANT
COVER LIGHT HANDLE STERIS (MISCELLANEOUS) ×4 IMPLANT
COVER WAND RF STERILE (DRAPES) ×2 IMPLANT
DERMABOND ADVANCED (GAUZE/BANDAGES/DRESSINGS) ×1
DERMABOND ADVANCED .7 DNX12 (GAUZE/BANDAGES/DRESSINGS) ×1 IMPLANT
ELECT REM PT RETURN 9FT ADLT (ELECTROSURGICAL) ×2
ELECTRODE REM PT RTRN 9FT ADLT (ELECTROSURGICAL) ×1 IMPLANT
FILTER SMOKE EVAC LAPAROSHD (FILTER) ×2 IMPLANT
GLOVE BIO SURGEON STRL SZ7 (GLOVE) ×2 IMPLANT
GLOVE BIOGEL PI IND STRL 7.0 (GLOVE) ×1 IMPLANT
GLOVE BIOGEL PI INDICATOR 7.0 (GLOVE) ×1
GLOVE ECLIPSE 6.5 STRL STRAW (GLOVE) ×2 IMPLANT
GLOVE SURG SS PI 7.5 STRL IVOR (GLOVE) ×2 IMPLANT
GOWN STRL REUS W/TWL LRG LVL3 (GOWN DISPOSABLE) ×6 IMPLANT
HEMOSTAT SNOW SURGICEL 2X4 (HEMOSTASIS) ×2 IMPLANT
INST SET LAPROSCOPIC AP (KITS) ×2 IMPLANT
IV NS IRRIG 3000ML ARTHROMATIC (IV SOLUTION) IMPLANT
KIT TURNOVER KIT A (KITS) ×2 IMPLANT
MANIFOLD NEPTUNE II (INSTRUMENTS) ×2 IMPLANT
NEEDLE HYPO 18GX1.5 BLUNT FILL (NEEDLE) ×2 IMPLANT
NEEDLE HYPO 22GX1.5 SAFETY (NEEDLE) ×2 IMPLANT
NEEDLE INSUFFLATION 14GA 120MM (NEEDLE) ×2 IMPLANT
NS IRRIG 1000ML POUR BTL (IV SOLUTION) ×2 IMPLANT
PACK LAP CHOLE LZT030E (CUSTOM PROCEDURE TRAY) ×2 IMPLANT
PAD ARMBOARD 7.5X6 YLW CONV (MISCELLANEOUS) ×2 IMPLANT
SET BASIN LINEN APH (SET/KITS/TRAYS/PACK) ×2 IMPLANT
SLEEVE ENDOPATH XCEL 5M (ENDOMECHANICALS) ×2 IMPLANT
SUT MNCRL AB 4-0 PS2 18 (SUTURE) ×4 IMPLANT
SUT VICRYL 0 UR6 27IN ABS (SUTURE) ×2 IMPLANT
SYR 20CC LL (SYRINGE) ×2 IMPLANT
SYS BAG RETRIEVAL 10MM (BASKET) ×1
SYSTEM BAG RETRIEVAL 10MM (BASKET) ×1 IMPLANT
TROCAR ENDO BLADELESS 11MM (ENDOMECHANICALS) ×2 IMPLANT
TROCAR XCEL NON-BLD 5MMX100MML (ENDOMECHANICALS) ×2 IMPLANT
TROCAR XCEL UNIV SLVE 11M 100M (ENDOMECHANICALS) ×2 IMPLANT
TUBE CONNECTING 12X1/4 (SUCTIONS) ×2 IMPLANT
WARMER LAPAROSCOPE (MISCELLANEOUS) ×2 IMPLANT

## 2018-11-17 NOTE — Anesthesia Procedure Notes (Signed)
Procedure Name: Intubation Date/Time: 11/17/2018 7:34 AM Performed by: Andree Elk, Amy A, CRNA Pre-anesthesia Checklist: Patient identified, Patient being monitored, Timeout performed, Emergency Drugs available and Suction available Patient Re-evaluated:Patient Re-evaluated prior to induction Oxygen Delivery Method: Circle system utilized Preoxygenation: Pre-oxygenation with 100% oxygen Induction Type: IV induction and Rapid sequence Laryngoscope Size: 3 and Glidescope Grade View: Grade I Tube type: Oral Tube size: 7.0 mm Number of attempts: 1 Airway Equipment and Method: Stylet Placement Confirmation: ETT inserted through vocal cords under direct vision,  positive ETCO2 and breath sounds checked- equal and bilateral Secured at: 21 cm Tube secured with: Tape Dental Injury: Teeth and Oropharynx as per pre-operative assessment

## 2018-11-17 NOTE — Interval H&P Note (Signed)
History and Physical Interval Note:  11/17/2018 7:15 AM  Jody Warren  has presented today for surgery, with the diagnosis of chronic cholelithiasis.  The various methods of treatment have been discussed with the patient and family. After consideration of risks, benefits and other options for treatment, the patient has consented to  Procedure(s): LAPAROSCOPIC CHOLECYSTECTOMY (N/A) as a surgical intervention.  The patient's history has been reviewed, patient examined, no change in status, stable for surgery.  I have reviewed the patient's chart and labs.  Questions were answered to the patient's satisfaction.     Aviva Signs

## 2018-11-17 NOTE — Discharge Instructions (Signed)
Laparoscopic Cholecystectomy, Care After  This sheet gives you information about how to care for yourself after your procedure. Your health care provider may also give you more specific instructions. If you have problems or questions, contact your health care provider.  What can I expect after the procedure?  After the procedure, it is common to have:   Pain at your incision sites. You will be given medicines to control this pain.   Mild nausea or vomiting.    Bloating and possible shoulder pain from the air-like gas that was used during the procedure.  Follow these instructions at home:  Incision care     Follow instructions from your health care provider about how to take care of your incisions. Make sure you:  ? Wash your hands with soap and water before you change your bandage (dressing). If soap and water are not available, use hand sanitizer.  ? Change your dressing as told by your health care provider.  ? Leave stitches (sutures), skin glue, or adhesive strips in place. These skin closures may need to be in place for 2 weeks or longer. If adhesive strip edges start to loosen and curl up, you may trim the loose edges. Do not remove adhesive strips completely unless your health care provider tells you to do that.   Do not take baths, swim, or use a hot tub until your health care provider approves. Ask your health care provider if you can take showers. You may only be allowed to take sponge baths for bathing.   Check your incision area every day for signs of infection. Check for:  ? More redness, swelling, or pain.  ? More fluid or blood.  ? Warmth.  ? Pus or a bad smell.  Activity   Do not drive or use heavy machinery while taking prescription pain medicine.   Do not lift anything that is heavier than 10 lb (4.5 kg) until your health care provider approves.   Do not play contact sports until your health care provider approves.   Do not drive for 24 hours if you were given a medicine to help you relax  (sedative).   Rest as needed. Do not return to work or school until your health care provider approves.  General instructions   Take over-the-counter and prescription medicines only as told by your health care provider.   To prevent or treat constipation while you are taking prescription pain medicine, your health care provider may recommend that you:  ? Drink enough fluid to keep your urine clear or pale yellow.  ? Take over-the-counter or prescription medicines.  ? Eat foods that are high in fiber, such as fresh fruits and vegetables, whole grains, and beans.  ? Limit foods that are high in fat and processed sugars, such as fried and sweet foods.  Contact a health care provider if:   You develop a rash.   You have more redness, swelling, or pain around your incisions.   You have more fluid or blood coming from your incisions.   Your incisions feel warm to the touch.   You have pus or a bad smell coming from your incisions.   You have a fever.   One or more of your incisions breaks open.  Get help right away if:   You have trouble breathing.   You have chest pain.   You have increasing pain in your shoulders.   You faint or feel dizzy when you stand.   You have   severe pain in your abdomen.   You have nausea or vomiting that lasts for more than one day.   You have leg pain.  This information is not intended to replace advice given to you by your health care provider. Make sure you discuss any questions you have with your health care provider.  Document Released: 05/17/2005 Document Revised: 12/06/2015 Document Reviewed: 11/03/2015  Elsevier Interactive Patient Education  2019 [employer]vier Inc[employer]

## 2018-11-17 NOTE — Transfer of Care (Signed)
Immediate Anesthesia Transfer of Care Note  Patient: Jody Warren  Procedure(s) Performed: LAPAROSCOPIC CHOLECYSTECTOMY (N/A Abdomen)  Patient Location: PACU  Anesthesia Type:General  Level of Consciousness: awake, oriented and patient cooperative  Airway & Oxygen Therapy: Patient Spontanous Breathing  Post-op Assessment: Report given to RN and Post -op Vital signs reviewed and stable  Post vital signs: Reviewed and stable  Last Vitals:  Vitals Value Taken Time  BP 133/82 11/17/18 0816  Temp 36.8 C 11/17/18 0816  Pulse 91 11/17/18 0820  Resp 36 11/17/18 0820  SpO2 90 % 11/17/18 0820  Vitals shown include unvalidated device data.  Last Pain:  Vitals:   11/17/18 0816  PainSc: Asleep         Complications: No apparent anesthesia complications

## 2018-11-17 NOTE — Anesthesia Postprocedure Evaluation (Signed)
Anesthesia Post Note Late entry for 0835  Patient: Jody Warren  Procedure(s) Performed: LAPAROSCOPIC CHOLECYSTECTOMY (N/A Abdomen)  Patient location during evaluation: PACU Anesthesia Type: General Level of consciousness: awake and alert, oriented and patient cooperative Pain management: pain level controlled Vital Signs Assessment: post-procedure vital signs reviewed and stable Respiratory status: spontaneous breathing Postop Assessment: no apparent nausea or vomiting Anesthetic complications: no     Last Vitals:  Vitals:   11/17/18 0900 11/17/18 0907  BP: (!) 130/91   Pulse: (!) 101 71  Resp: 18 (!) 24  Temp:    SpO2: 98% 94%    Last Pain:  Vitals:   11/17/18 0907  PainSc: Asleep                 Shalamar Crays A

## 2018-11-17 NOTE — Anesthesia Preprocedure Evaluation (Addendum)
Anesthesia Evaluation  Patient identified by MRN, date of birth, ID band Patient awake    Reviewed: Allergy & Precautions, NPO status , Patient's Chart, lab work & pertinent test results  Airway Mallampati: II  TM Distance: >3 FB Neck ROM: Full    Dental no notable dental hx. (+) Teeth Intact   Pulmonary asthma , Current Smoker,  Reports remote inhaler use -none in over 10 years  MO-BMI>45 Snores  Never tested for OSA   Pulmonary exam normal breath sounds clear to auscultation       Cardiovascular Exercise Tolerance: Good negative cardio ROS Normal cardiovascular examI Rhythm:Regular Rate:Normal     Neuro/Psych  Headaches, negative psych ROS   GI/Hepatic Neg liver ROS, GERD  Controlled,Only uses OTC for GERD -denies any GERD SX today   Endo/Other  Morbid obesity  Renal/GU Renal diseaseH/o kidney stones   negative genitourinary   Musculoskeletal negative musculoskeletal ROS (+)   Abdominal   Peds negative pediatric ROS (+)  Hematology negative hematology ROS (+)   Anesthesia Other Findings   Reproductive/Obstetrics negative OB ROS                            Anesthesia Physical Anesthesia Plan  ASA: III  Anesthesia Plan: General   Post-op Pain Management:    Induction: Intravenous  PONV Risk Score and Plan: 2 and Ondansetron, Treatment may vary due to age or medical condition and Dexamethasone  Airway Management Planned: Oral ETT  Additional Equipment:   Intra-op Plan:   Post-operative Plan: Extubation in OR  Informed Consent: I have reviewed the patients History and Physical, chart, labs and discussed the procedure including the risks, benefits and alternatives for the proposed anesthesia with the patient or authorized representative who has indicated his/her understanding and acceptance.     Dental advisory given  Plan Discussed with: CRNA  Anesthesia Plan  Comments: (Plan Full PPE use  Plan GETA)        Anesthesia Quick Evaluation

## 2018-11-17 NOTE — Op Note (Signed)
Patient:  Jody Warren  DOB:  [birth year]  MRN:  [id number]   Preop Diagnosis: Chronic cholecystitis  Postop Diagnosis: Same  Procedure: Laparoscopic cholecystectomy  Surgeon: Aviva Signs, MD  Anes: General endotracheal  Indications: Patient is a 33 year old black female who presents with biliary colic secondary to chronic cholecystitis.  The risks and benefits of the procedure including bleeding, infection, hepatobiliary injury, and the possibility of an open procedure were fully explained to the patient, who gave informed consent.  Procedure note: The patient was placed in the supine position.  After induction of general endotracheal anesthesia, the abdomen was prepped and draped using the usual sterile technique with ChloraPrep.  Surgical site confirmation was performed.  A supraumbilical incision was made down to the fascia.  A Veress needle was introduced into the abdominal cavity and confirmation of placement was done using the saline drop test.  The abdomen was then insufflated to 16 mmHg pressure.  An 11 mm trocar was introduced into the abdominal cavity under direct visualization without difficulty.  The patient was placed in reverse Trendelenburg position and an additional 11 mm trocar was placed in the epigastric region and 5 mm trochars were placed the right upper quadrant and right flank regions.  Liver was inspected noted to be within normal limits.  The gallbladder was retracted in a dynamic fashion in order to provide a critical view of the triangle of Calot.  The cystic duct was first identified.  Its juncture to the infundibulum was fully identified.  Endoclips were placed proximally distally on the cystic duct, and the cystic duct was divided.  This was likewise done to the cystic artery.  The gallbladder was freed away from the gallbladder fossa using Bovie electrocautery.  The gallbladder was delivered through the epigastric trocar site using an Endo Catch bag.  The  gallbladder fossa was inspected and no abnormal bleeding or bile leakage was noted.  Surgicel was placed in the gallbladder fossa.  All fluid and air were then evacuated from the abdominal cavity prior to the removal of the trochars.  All wounds were irrigated with normal saline.  All wounds were injected with Exparel.  All incisions were closed using a 4-0 Monocryl subcuticular suture.  Dermabond was applied.  All tape and needle counts were correct at the end of the procedure.  The patient was extubated in the operating room and transferred to PACU in stable condition.  Complications: None  EBL: Minimal  Specimen: Gallbladder

## 2018-11-20 ENCOUNTER — Encounter (HOSPITAL_COMMUNITY): Payer: Self-pay | Admitting: General Surgery

## 2018-11-23 ENCOUNTER — Other Ambulatory Visit: Payer: Self-pay

## 2018-11-23 ENCOUNTER — Telehealth (INDEPENDENT_AMBULATORY_CARE_PROVIDER_SITE_OTHER): Payer: Self-pay | Admitting: General Surgery

## 2018-11-23 ENCOUNTER — Telehealth: Payer: Self-pay | Admitting: General Surgery

## 2018-11-23 DIAGNOSIS — Z09 Encounter for follow-up examination after completed treatment for conditions other than malignant neoplasm: Secondary | ICD-10-CM

## 2018-11-23 NOTE — Telephone Encounter (Signed)
Virtual follow-up visit done by phone.  Patient states her preoperative symptoms have resolved.  She has no significant incisional pain.  She denies any fever or chills.  She is pleased with the results.  I told her to call the office should she have any issues.

## 2019-01-16 ENCOUNTER — Other Ambulatory Visit: Payer: Self-pay

## 2019-01-16 ENCOUNTER — Encounter: Payer: Self-pay | Admitting: Gastroenterology

## 2019-01-16 ENCOUNTER — Ambulatory Visit (INDEPENDENT_AMBULATORY_CARE_PROVIDER_SITE_OTHER): Payer: Self-pay | Admitting: Gastroenterology

## 2019-01-16 DIAGNOSIS — R195 Other fecal abnormalities: Secondary | ICD-10-CM

## 2019-01-16 DIAGNOSIS — K219 Gastro-esophageal reflux disease without esophagitis: Secondary | ICD-10-CM

## 2019-01-16 DIAGNOSIS — K828 Other specified diseases of gallbladder: Secondary | ICD-10-CM | POA: Insufficient documentation

## 2019-01-16 NOTE — Patient Instructions (Addendum)
I am glad you are doing well!  Continue avoiding reflux triggers.   Avoid these foods as they will contribute to loose stools:  High-fat foods. These include baked goods, fast food, fatty cuts of meat, ice cream, french toast, sweet rolls, pizza, cheese bread, foods covered with butter, creamy sauces, or cheese.  Fried foods. These include french fries, tempura, battered fish, breaded chicken, fried breads, and sweets.  We will plan to see you back in 4 months for follow-up. Please call if concerns prior.   Aliene Altes, PA-C [facility]

## 2019-01-16 NOTE — Progress Notes (Signed)
Referring Provider: Fayrene Helper, MD Primary Care Physician:  Fayrene Helper, MD Primary GI Physician: Dr. Oneida Alar  Chief Complaint  Patient presents with  . Biliary Dyskinesia  . Diarrhea    HPI:   Jody Warren is a 33 y.o. female presenting today with past medical history significant for GERD. She is presenting today for follow-up. Patient was last seen by our office via telemedicine visit on 09/11/18 for follow-up of GERD and intermittent episodes of epigastric pain. Her epigastric pain had been associated with a bump in LFTs in 2019. LFTs back within normal limits in Jan 2020. [place] in September 2019 without gallstones. Diffuse fatty infiltration noted. At this visit, her GERD symptoms were well controlled since making dietary adjustments, and she had been doing well without epigastric pain until a week prior to the visit when she had another acute episode. Plans to pursue HIDA and have standing HFP and Lipase to be completed during next acute episode of pain.   HFP and Lipase within normal limits on 10/12/18. HIDA on 10/13/18 with evidence of biliary dyskinesia. Patient subsequently saw Dr. Arnoldo Morale and underwent laparoscopic cholecystectomy on 11/17/18.   Today she states she is doing well since her surgery. States she had lost weight prior to her cholecystectomy as she had cut out a lot of foods, but since her surgery, she has been able to eat anything she wants and has gained her weight back. She is not having any regular reflux symptoms at this time. Only taking pepcid as needed. Tacos is the only thing that gives her reflux/heartburn. No dysphagia. No nausea or vomiting. No abdominal pain. Bowels are moving daily. Stools are more mushy. Usually after meals. Fatty/fried foods will go right through her. About 3 BMs a day. Not interfering with daily activities. States "I just know when and when not to eat." No hematochezia or melena.   Aleve maybe 1-2 times a month if needed.   Wine maybe once a month.   Past Medical History:  Diagnosis Date  . Asthma since birth    no current meds  . GERD (gastroesophageal reflux disease)   . History of kidney stones   . Kidney stone   . Obesity     Past Surgical History:  Procedure Laterality Date  . CESAREAN SECTION N/A 12/07/2014   Procedure: CESAREAN SECTION;  Surgeon: Mora Bellman, MD;  Location: [facility];  Service: Obstetrics;  Laterality: N/A;  . CHOLECYSTECTOMY N/A 11/17/2018   Procedure: LAPAROSCOPIC CHOLECYSTECTOMY;  Surgeon: Aviva Signs, MD;  Location: [facility];  Service: General;  Laterality: N/A;    Current Outpatient Medications  Medication Sig Dispense Refill  . famotidine (PEPCID) 10 MG tablet Take 10 mg by mouth as needed for heartburn or indigestion.    . naproxen sodium (ALEVE) 220 MG tablet Take 220 mg by mouth daily as needed.    . Probiotic Product (PROBIOTIC PO) Take 2 tablets by mouth daily.     No current facility-administered medications for this visit.     Allergies as of 01/16/2019 - Review Complete 11/17/2018  Allergen Reaction Noted  . Peanuts [peanut oil] Swelling 11/14/2013    Family History  Problem Relation Age of Onset  . Hyperlipidemia Mother   . Hypertension Father   . Diabetes Father   . Cancer Maternal Grandfather   . Colon cancer Neg Hx   . Colon polyps Neg Hx     Social History   Socioeconomic History  . Marital status: Single  Spouse name: Not on file  . Number of children: 1  . Years of education: Not on file  . Highest education level: Not on file  Occupational History  . Occupation: Clinical biochemistCustomer Service Rep at [employer]   Social Needs  . Financial resource strain: Not on file  . Food insecurity    Worry: Not on file    Inability: Not on file  . Transportation needs    Medical: Not on file    Non-medical: Not on file  Tobacco Use  . Smoking status: Current Some Day Smoker    Packs/day: 0.25    Years: 12.00    Pack years: 3.00    Types: Cigarettes  .  Smokeless tobacco: Never Used  . Tobacco comment: smokes 2 cig qother day.  Substance and Sexual Activity  . Alcohol use: Yes    Comment: wine about once a month  . Drug use: Never  . Sexual activity: Not Currently    Birth control/protection: None  Lifestyle  . Physical activity    Days per week: Not on file    Minutes per session: Not on file  . Stress: Not on file  Relationships  . Social Musicianconnections    Talks on phone: Not on file    Gets together: Not on file    Attends religious service: Not on file    Active member of club or organization: Not on file    Attends meetings of clubs or organizations: Not on file    Relationship status: Not on file  Other Topics Concern  . Not on file  Social History Narrative  . Not on file    Review of Systems: Gen: Denies fever, chills, lightheadedness, dizziness, or pre-syncope.  CV: Denies chest pain, palpitations, or peripheral edema Resp: Denies dyspnea at rest, or cough GI: See HPI Derm: Denies rash, itching, dry skin Psych: Denies depression, anxiety  Heme: Denies bruising, bleeding  Physical Exam: BP 115/79   Pulse 79   Temp 98 F (36.7 C) (Oral)   Ht 5\' 5"  (1.651 m)   Wt 291 lb 3.2 oz (132.1 kg)   BMI 48.46 kg/m  General:   Alert and oriented. No distress noted. Pleasant and cooperative.  Head:  Normocephalic and atraumatic. Eyes:  Conjuctiva clear without scleral icterus. Heart:  S1, S2 present without murmurs appreciated. Lungs:  Clear to auscultation bilaterally. No wheezes, rales, or rhonchi. No distress.  Abdomen:  +BS, soft, non-tender and non-distended. No rebound or guarding. No HSM or masses noted. Msk:  Symmetrical without gross deformities. Normal posture. Extremities:  Without edema. Neurologic:  Alert and  oriented x4 Psych:  Normal mood and affect.

## 2019-01-17 ENCOUNTER — Encounter: Payer: Self-pay | Admitting: Gastroenterology

## 2019-01-18 DIAGNOSIS — K219 Gastro-esophageal reflux disease without esophagitis: Secondary | ICD-10-CM | POA: Insufficient documentation

## 2019-01-18 NOTE — Assessment & Plan Note (Signed)
33 y.o. female s/p cholecystectomy on 11/17/18 now with postprandial loose stools. Having about 3 BMs a day. No night time stools. Not interfering with daily activities. Patient has been eating a lot of fried foods since her surgery as she had to cut this out prior. I suspect this is bile salt diarrhea secondary to cholecystectomy. Explained to patient that this is a well known symptom that some people develop after cholecystectomy. Advised she limit the fried/fatty foods. Discussed the role of Colestid or Questran, but patient is not interested in taking any medications at this time as she is not bothered by the loose stools. I expect this may improve over the next several months.   Will plan to see patient back in about 4 months for follow-up.

## 2019-01-18 NOTE — Assessment & Plan Note (Signed)
GERD is well managed by avoiding dietary triggers. Continue Pepcid as needed for occasional reflux.

## 2019-01-18 NOTE — Assessment & Plan Note (Signed)
S/p cholecystectomy on 11/17/18. Patient is doing well. No longer with any abdominal pain. She does have loose stools as described below.

## 2019-01-22 NOTE — Progress Notes (Signed)
cc'ed to pcp °

## 2019-05-16 ENCOUNTER — Ambulatory Visit: Payer: Self-pay | Admitting: Gastroenterology

## 2019-06-01 HISTORY — PX: CHOLECYSTECTOMY: SHX55

## 2019-10-13 IMAGING — NM NUCLEAR MEDICINE HEPATOBILIARY IMAGING WITH GALLBLADDER EF
2 series · 12 of 12 positions shown · non-contrast
Comparison: None.

CLINICAL DATA: Abdominal pain and nausea

EXAM:
NUCLEAR MEDICINE HEPATOBILIARY IMAGING WITH GALLBLADDER EF
VIEWS:
Anterior right upper quadrant
RADIOPHARMACEUTICALS:  5.3 mCi 0c-IIm  Choletec IV

[Series 1: biliary · 3.25mm/px · 6 of 60 frames shown]
[frame 6/60]
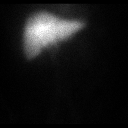
[frame 16/60]
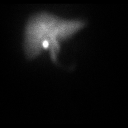
[frame 26/60]
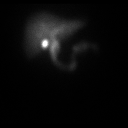
[frame 36/60]
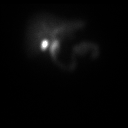
[frame 46/60]
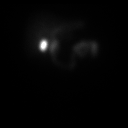
[frame 56/60]
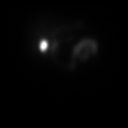

[Series 2: gbef · 3.25mm/px · 6 of 60 frames shown]
[frame 6/60]
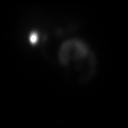
[frame 16/60]
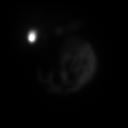
[frame 26/60]
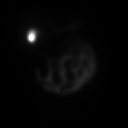
[frame 36/60]
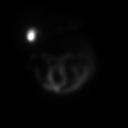
[frame 46/60]
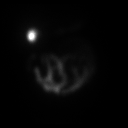
[frame 56/60]
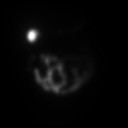

[12 of 12 positions shown; findings below may reference images not displayed]

FINDINGS: Liver uptake of radiotracer is unremarkable. Liver uptake of
radiotracer is unremarkable. There is prompt visualization of
gallbladder and small bowel, indicating patency of the cystic and
common bile ducts. Patient consumed 8 ounces of Ensure Plus orally
with calculation of the computer generated ejection fraction of
radiotracer from the gallbladder. The patient did not experience
clinical symptoms with the oral Ensure consumption. The computer
generated ejection fraction of radiotracer from the gallbladder is
abnormally low at 22%, normal greater than 33% using the oral agent.
IMPRESSION: Abnormally low ejection fraction of radiotracer from the
gallbladder, a finding indicative of biliary dyskinesia. Cystic and
common bile ducts are patent as is evidenced by visualization of
gallbladder and small bowel.

## 2020-08-29 DIAGNOSIS — Z111 Encounter for screening for respiratory tuberculosis: Secondary | ICD-10-CM | POA: Diagnosis not present

## 2020-08-29 DIAGNOSIS — Z0184 Encounter for antibody response examination: Secondary | ICD-10-CM | POA: Diagnosis not present

## 2020-09-03 DIAGNOSIS — L83 Acanthosis nigricans: Secondary | ICD-10-CM | POA: Diagnosis not present

## 2020-09-03 DIAGNOSIS — Z3009 Encounter for other general counseling and advice on contraception: Secondary | ICD-10-CM | POA: Diagnosis not present

## 2020-09-03 DIAGNOSIS — Z975 Presence of (intrauterine) contraceptive device: Secondary | ICD-10-CM | POA: Diagnosis not present

## 2020-09-03 DIAGNOSIS — R030 Elevated blood-pressure reading, without diagnosis of hypertension: Secondary | ICD-10-CM | POA: Diagnosis not present

## 2020-09-03 DIAGNOSIS — Z01419 Encounter for gynecological examination (general) (routine) without abnormal findings: Secondary | ICD-10-CM | POA: Diagnosis not present

## 2020-09-03 DIAGNOSIS — Z13220 Encounter for screening for lipoid disorders: Secondary | ICD-10-CM | POA: Diagnosis not present

## 2020-09-15 DIAGNOSIS — H1131 Conjunctival hemorrhage, right eye: Secondary | ICD-10-CM | POA: Diagnosis not present

## 2020-11-25 DIAGNOSIS — Z01812 Encounter for preprocedural laboratory examination: Secondary | ICD-10-CM | POA: Diagnosis not present

## 2020-11-25 DIAGNOSIS — Z0189 Encounter for other specified special examinations: Secondary | ICD-10-CM | POA: Diagnosis not present

## 2021-03-10 ENCOUNTER — Ambulatory Visit: Payer: Self-pay | Admitting: Women's Health

## 2021-03-25 ENCOUNTER — Ambulatory Visit: Payer: Self-pay | Admitting: Women's Health

## 2021-05-07 DIAGNOSIS — Z30431 Encounter for routine checking of intrauterine contraceptive device: Secondary | ICD-10-CM | POA: Diagnosis not present

## 2021-05-07 DIAGNOSIS — N760 Acute vaginitis: Secondary | ICD-10-CM | POA: Diagnosis not present

## 2021-05-07 DIAGNOSIS — Z3009 Encounter for other general counseling and advice on contraception: Secondary | ICD-10-CM | POA: Diagnosis not present

## 2021-06-29 DIAGNOSIS — J3489 Other specified disorders of nose and nasal sinuses: Secondary | ICD-10-CM | POA: Diagnosis not present

## 2021-06-29 DIAGNOSIS — R52 Pain, unspecified: Secondary | ICD-10-CM | POA: Diagnosis not present

## 2021-06-29 DIAGNOSIS — J029 Acute pharyngitis, unspecified: Secondary | ICD-10-CM | POA: Diagnosis not present

## 2021-07-21 DIAGNOSIS — L732 Hidradenitis suppurativa: Secondary | ICD-10-CM | POA: Diagnosis not present

## 2021-07-21 DIAGNOSIS — N611 Abscess of the breast and nipple: Secondary | ICD-10-CM | POA: Diagnosis not present

## 2021-08-06 DIAGNOSIS — Z713 Dietary counseling and surveillance: Secondary | ICD-10-CM | POA: Diagnosis not present

## 2021-10-07 ENCOUNTER — Encounter: Payer: Self-pay | Admitting: Family Medicine

## 2021-10-07 ENCOUNTER — Ambulatory Visit: Payer: BC Managed Care – PPO | Admitting: Family Medicine

## 2021-10-07 DIAGNOSIS — Z1159 Encounter for screening for other viral diseases: Secondary | ICD-10-CM | POA: Diagnosis not present

## 2021-10-07 DIAGNOSIS — E559 Vitamin D deficiency, unspecified: Secondary | ICD-10-CM | POA: Diagnosis not present

## 2021-10-07 DIAGNOSIS — Z6841 Body Mass Index (BMI) 40.0 and over, adult: Secondary | ICD-10-CM

## 2021-10-07 DIAGNOSIS — Z30432 Encounter for removal of intrauterine contraceptive device: Secondary | ICD-10-CM

## 2021-10-07 DIAGNOSIS — R7301 Impaired fasting glucose: Secondary | ICD-10-CM

## 2021-10-07 NOTE — Assessment & Plan Note (Signed)
Referral made to general surgery regarding patient's request for bariatric surgery. ?

## 2021-10-07 NOTE — Patient Instructions (Addendum)
I appreciate the opportunity to provide care to you today! ? ?  ?Follow up:  3 months ? ?Labs: please stop by the lab during the week to get your blood drawn (CBC, CMP, TSH, Lipid profile, HgA1c, Vit D) ? ?Screening: Hep C ? ?Referrals today-  General surgery ?  ?Please continue to a heart-healthy diet and increase your physical activities. Try to exercise for 71mins at least three times a week.  ? ? ?  ?It was a pleasure to see you and I look forward to continuing to work together on your health and well-being. ?Please do not hesitate to call the office if you need care or have questions about your care. ?  ?Have a wonderful day and week. ?With Gratitude, ?Alvira Monday MSN, FNP-BC ? ?

## 2021-10-07 NOTE — Assessment & Plan Note (Signed)
-  Advised patient to follow up at the health department for removal of her IUD (Mirena) ?Inform patient that pap can be performed at her next visit ? ?

## 2021-10-07 NOTE — Progress Notes (Signed)
? ?New Patient Office Visit ? ?Subjective:  ?Patient ID: Jody Warren, female    DOB: Oct 22, 1985  Age: 36 y.o. MRN: [id number] ? ?CC:  ?Chief Complaint  ?Patient presents with  ? New Patient (Initial Visit)  ?  Pt would like to establish, previously seeing Al[facility] at [facility] Pt would like to discuss weight gain, is also due for pap smear last one was abnormal also needs IUD removed.   ? ? ?HPI ?Jody Warren is a 36 y.o. female with past medical history of migraine, asthma, GERD presents for establishing care. She reported taking phentermine and the HCG injections for weight loss a  year ago and weaned herself from it three months ago. Both medications were prescribed at the bariatric clinic in [place], where she received care. She notes that she has regained her weight and inquires about bariatric surgery. She plateaus at 270 lbs while taking the medications and has attempted lifestyle modifications. She reports exercising three days a week and eating healthier. She notes that she is due for a pap and would like her IUD removed. ? ?Past Medical History:  ?Diagnosis Date  ? Asthma since birth   ? no current meds  ? GERD (gastroesophageal reflux disease)   ? History of kidney stones   ? Kidney stone   ? Obesity   ? ? ?Past Surgical History:  ?Procedure Laterality Date  ? CESAREAN SECTION N/A 12/07/2014  ? Procedure: CESAREAN SECTION;  Surgeon: Mora Bellman, MD;  Location: [facility];  Service: Obstetrics;  Laterality: N/A;  ? CHOLECYSTECTOMY N/A 11/17/2018  ? Procedure: LAPAROSCOPIC CHOLECYSTECTOMY;  Surgeon: Aviva Signs, MD;  Location: [facility];  Service: General;  Laterality: N/A;  ? ? ?Family History  ?Problem Relation Age of Onset  ? Hyperlipidemia Mother   ? Hypertension Father   ? Diabetes Father   ? Cancer Maternal Grandfather   ? Colon cancer Neg Hx   ? Colon polyps Neg Hx   ? ? ?Social History  ? ?Socioeconomic History  ? Marital status: Single  ?  Spouse name: Not on file  ?  Number of children: 1  ? Years of education: Not on file  ? Highest education level: Not on file  ?Occupational History  ? Occupation: Therapist, art Rep at [employer]   ?Tobacco Use  ? Smoking status: Some Days  ?  Packs/day: 0.25  ?  Years: 12.00  ?  Pack years: 3.00  ?  Types: Cigarettes  ? Smokeless tobacco: Never  ? Tobacco comments:  ?  smokes 2 cig qother day.  ?Vaping Use  ? Vaping Use: Never used  ?Substance and Sexual Activity  ? Alcohol use: Yes  ?  Comment: wine about once a month  ? Drug use: Never  ? Sexual activity: Not Currently  ?  Birth control/protection: None  ?Other Topics Concern  ? Not on file  ?Social History Narrative  ? Not on file  ? ?Social Determinants of Health  ? ?Financial Resource Strain: Not on file  ?Food Insecurity: Not on file  ?Transportation Needs: Not on file  ?Physical Activity: Not on file  ?Stress: Not on file  ?Social Connections: Not on file  ?Intimate Partner Violence: Not on file  ? ? ?ROS ?Review of Systems  ?Constitutional:  Negative for chills, fatigue and fever.  ?HENT:  Negative for congestion, rhinorrhea, sinus pressure and sinus pain.   ?Eyes:  Negative for pain, itching and visual disturbance.  ?Respiratory:  Negative  for chest tightness and shortness of breath.   ?Cardiovascular:  Negative for chest pain and palpitations.  ?Gastrointestinal:  Negative for constipation, diarrhea, nausea and vomiting.  ?Endocrine: Negative for polydipsia, polyphagia and polyuria.  ?Genitourinary:  Negative for frequency and urgency.  ?Musculoskeletal:  Negative for gait problem and neck pain.  ?Skin:  Negative for rash and wound.  ?Neurological:  Negative for dizziness, tremors and headaches.  ?Psychiatric/Behavioral:  Negative for sleep disturbance and suicidal ideas.   ? ?Objective:  ? ?Today's Vitals: BP 118/77 (BP Location: Left Arm, Patient Position: Sitting)   Pulse (!) 103   Ht 5' 5" (1.651 m)   Wt 299 lb 12.8 oz (136 kg)   SpO2 98%   BMI 49.89 kg/m?  ? ?Physical  Exam ?HENT:  ?   Right Ear: External ear normal.  ?   Left Ear: External ear normal.  ?   Nose: Nose normal.  ?   Mouth/Throat:  ?   Mouth: Mucous membranes are moist.  ?Eyes:  ?   Extraocular Movements: Extraocular movements intact.  ?Cardiovascular:  ?   Rate and Rhythm: Normal rate and regular rhythm.  ?   Pulses: Normal pulses.  ?   Heart sounds: Normal heart sounds.  ?Pulmonary:  ?   Effort: Pulmonary effort is normal.  ?   Breath sounds: Normal breath sounds.  ?Abdominal:  ?   Palpations: Abdomen is soft.  ?Musculoskeletal:  ?   Cervical back: No rigidity or tenderness.  ?   Right lower leg: No edema.  ?   Left lower leg: No edema.  ?Skin: ?   Findings: No lesion or rash.  ?Neurological:  ?   Mental Status: She is alert and oriented to person, place, and time.  ?Psychiatric:  ?   Comments: Normal affect  ? ? ?Assessment & Plan:  ? ?Problem List Items Addressed This Visit   ? ?  ? Other  ? OBESITY, UNSPECIFIED  ?  Referral made to general surgery regarding patient's request for bariatric surgery. ? ?  ?  ? Relevant Orders  ? CBC with Differential/Platelet  ? CMP14+EGFR  ? TSH + free T4  ? Lipid panel  ? Encounter for IUD removal  ?  -Advised patient to follow up at the health department for removal of her IUD (Mirena) ?Inform patient that pap can be performed at her next visit ? ? ?  ?  ? ?Other Visit Diagnoses   ? ? IFG (impaired fasting glucose)    -  Primary  ? Relevant Orders  ? Hemoglobin A1C  ? Need for hepatitis C screening test      ? Relevant Orders  ? Hepatitis C Antibody  ? Vitamin D deficiency      ? Relevant Orders  ? Vitamin D (25 hydroxy)  ? Morbid obesity with BMI of 40.0-44.9, adult ([place])      ? Relevant Orders  ? Ambulatory referral to General Surgery  ? ?  ? ? ?Outpatient Encounter Medications as of 10/07/2021  ?Medication Sig  ? famotidine (PEPCID) 10 MG tablet Take 10 mg by mouth as needed for heartburn or indigestion.  ? naproxen sodium (ALEVE) 220 MG tablet Take 220 mg by mouth daily as  needed. (Patient not taking: Reported on 10/07/2021)  ? Probiotic Product (PROBIOTIC PO) Take 2 tablets by mouth daily. (Patient not taking: Reported on 10/07/2021)  ? ?No facility-administered encounter medications on file as of 10/07/2021.  ? ? ?Follow-up: Return in about 3  months (around 01/07/2022).  ? ?Alvira Monday, FNP ?

## 2021-10-15 ENCOUNTER — Encounter: Payer: Self-pay | Admitting: Family Medicine

## 2021-10-16 ENCOUNTER — Other Ambulatory Visit: Payer: Self-pay | Admitting: Family Medicine

## 2021-10-16 NOTE — Telephone Encounter (Signed)
Please ask if she's still following up with the bariatric clinic in [place] because they prescribed her the phentermine and the HCG injections.

## 2021-10-19 DIAGNOSIS — E559 Vitamin D deficiency, unspecified: Secondary | ICD-10-CM | POA: Diagnosis not present

## 2021-10-19 DIAGNOSIS — Z1159 Encounter for screening for other viral diseases: Secondary | ICD-10-CM | POA: Diagnosis not present

## 2021-10-19 DIAGNOSIS — R7301 Impaired fasting glucose: Secondary | ICD-10-CM | POA: Diagnosis not present

## 2021-10-20 ENCOUNTER — Other Ambulatory Visit: Payer: Self-pay | Admitting: Family Medicine

## 2021-10-20 DIAGNOSIS — E559 Vitamin D deficiency, unspecified: Secondary | ICD-10-CM

## 2021-10-20 LAB — CMP14+EGFR
ALT: 19 IU/L (ref 0–32)
AST: 22 IU/L (ref 0–40)
Albumin/Globulin Ratio: 1.5 (ref 1.2–2.2)
Albumin: 4.1 g/dL (ref 3.8–4.8)
Alkaline Phosphatase: 65 IU/L (ref 44–121)
BUN/Creatinine Ratio: 18 (ref 9–23)
BUN: 15 mg/dL (ref 6–20)
Bilirubin Total: 0.3 mg/dL (ref 0.0–1.2)
CO2: 22 mmol/L (ref 20–29)
Calcium: 9 mg/dL (ref 8.7–10.2)
Chloride: 105 mmol/L (ref 96–106)
Creatinine, Ser: 0.85 mg/dL (ref 0.57–1.00)
Globulin, Total: 2.8 g/dL (ref 1.5–4.5)
Glucose: 84 mg/dL (ref 70–99)
Potassium: 4.4 mmol/L (ref 3.5–5.2)
Sodium: 139 mmol/L (ref 134–144)
Total Protein: 6.9 g/dL (ref 6.0–8.5)
eGFR: 92 mL/min/{1.73_m2} (ref >59)

## 2021-10-20 LAB — CBC WITH DIFFERENTIAL/PLATELET
Basophils Absolute: 0 10*3/uL (ref 0.0–0.2)
Basos: 0 %
EOS (ABSOLUTE): 0.5 10*3/uL — ABNORMAL HIGH (ref 0.0–0.4)
Eos: 6 %
Hematocrit: 45.5 % (ref 34.0–46.6)
Hemoglobin: 14.9 g/dL (ref 11.1–15.9)
Immature Grans (Abs): 0 10*3/uL (ref 0.0–0.1)
Immature Granulocytes: 0 %
Lymphocytes Absolute: 3.7 10*3/uL — ABNORMAL HIGH (ref 0.7–3.1)
Lymphs: 45 %
MCH: 29.1 pg (ref 26.6–33.0)
MCHC: 32.7 g/dL (ref 31.5–35.7)
MCV: 89 fL (ref 79–97)
Monocytes Absolute: 0.7 10*3/uL (ref 0.1–0.9)
Monocytes: 8 %
Neutrophils Absolute: 3.4 10*3/uL (ref 1.4–7.0)
Neutrophils: 41 %
Platelets: 331 10*3/uL (ref 150–450)
RBC: 5.12 x10E6/uL (ref 3.77–5.28)
RDW: 12.8 % (ref 11.7–15.4)
WBC: 8.3 10*3/uL (ref 3.4–10.8)

## 2021-10-20 LAB — LIPID PANEL
Chol/HDL Ratio: 2.7 ratio (ref 0.0–4.4)
Cholesterol, Total: 131 mg/dL (ref 100–199)
HDL: 49 mg/dL (ref >39)
LDL Chol Calc (NIH): 69 mg/dL (ref 0–99)
Triglycerides: 61 mg/dL (ref 0–149)
VLDL Cholesterol Cal: 13 mg/dL (ref 5–40)

## 2021-10-20 LAB — TSH+FREE T4
Free T4: 1.35 ng/dL (ref 0.82–1.77)
TSH: 1.41 u[IU]/mL (ref 0.450–4.500)

## 2021-10-20 LAB — HEMOGLOBIN A1C
Est. average glucose Bld gHb Est-mCnc: 117 mg/dL
Hgb A1c MFr Bld: 5.7 % — ABNORMAL HIGH (ref 4.8–5.6)

## 2021-10-20 LAB — VITAMIN D 25 HYDROXY (VIT D DEFICIENCY, FRACTURES): Vit D, 25-Hydroxy: 15.2 ng/mL — ABNORMAL LOW (ref 30.0–100.0)

## 2021-10-20 LAB — HEPATITIS C ANTIBODY: Hep C Virus Ab: NONREACTIVE

## 2021-10-20 MED ORDER — PHENTERMINE HCL 37.5 MG PO CAPS: 37.5000 mg | ORAL_CAPSULE | ORAL | 1 refills | Status: AC

## 2021-10-20 MED ORDER — VITAMIN D (ERGOCALCIFEROL) 1.25 MG (50000 UNIT) PO CAPS: 50000.0000 [IU] | ORAL_CAPSULE | ORAL | 1 refills | Status: DC

## 2021-10-20 NOTE — Progress Notes (Signed)
Please inform the patient to pick up her prescription of phentermine and Vit. D supplement. Her vit D is low. Also, inform her that she has prediabetes. I encourage lifestyle changes. Lifestyle changes include weight loss and exercising at least 3 days a week for 150 min weekly.  All other labs are within normal limits

## 2021-10-27 DIAGNOSIS — R87618 Other abnormal cytological findings on specimens from cervix uteri: Secondary | ICD-10-CM | POA: Diagnosis not present

## 2021-10-27 DIAGNOSIS — Z975 Presence of (intrauterine) contraceptive device: Secondary | ICD-10-CM | POA: Diagnosis not present

## 2021-10-27 DIAGNOSIS — Z3009 Encounter for other general counseling and advice on contraception: Secondary | ICD-10-CM | POA: Diagnosis not present

## 2021-10-27 DIAGNOSIS — Z124 Encounter for screening for malignant neoplasm of cervix: Secondary | ICD-10-CM | POA: Diagnosis not present

## 2021-12-08 ENCOUNTER — Telehealth: Payer: Self-pay | Admitting: Family Medicine

## 2021-12-08 NOTE — Telephone Encounter (Signed)
Patient called in regard to recent referral.  Initial referral location does not offer surgery patient is looking for.  Patient wants referral placed to [facility] located in [place], [place].  Can call patient back if needed.

## 2021-12-09 ENCOUNTER — Other Ambulatory Visit: Payer: Self-pay

## 2021-12-09 NOTE — Telephone Encounter (Signed)
Thank you :)

## 2021-12-09 NOTE — Telephone Encounter (Signed)
Sent referral spoke to patient states she would like referral to be sent to [facility] [place] [facility] instead says she called and they do bariatric surgery, cancelled referral to [facility] and sent to [facility].

## 2021-12-09 NOTE — Progress Notes (Signed)
referral

## 2021-12-09 NOTE — Progress Notes (Signed)
Referral

## 2022-01-08 ENCOUNTER — Encounter: Payer: Self-pay | Admitting: Family Medicine

## 2022-01-08 ENCOUNTER — Ambulatory Visit: Payer: BC Managed Care – PPO | Admitting: Family Medicine

## 2022-01-08 NOTE — Progress Notes (Signed)
Pap

## 2022-01-11 ENCOUNTER — Encounter: Payer: Self-pay | Admitting: Family Medicine

## 2022-01-11 ENCOUNTER — Ambulatory Visit (INDEPENDENT_AMBULATORY_CARE_PROVIDER_SITE_OTHER): Payer: BC Managed Care – PPO | Admitting: Family Medicine

## 2022-01-11 VITALS — BP 114/76 | HR 91 | Ht 65.0 in | Wt 305.0 lb

## 2022-01-11 DIAGNOSIS — R87618 Other abnormal cytological findings on specimens from cervix uteri: Secondary | ICD-10-CM | POA: Diagnosis not present

## 2022-01-11 DIAGNOSIS — Z6841 Body Mass Index (BMI) 40.0 and over, adult: Secondary | ICD-10-CM | POA: Diagnosis not present

## 2022-01-11 DIAGNOSIS — E559 Vitamin D deficiency, unspecified: Secondary | ICD-10-CM

## 2022-01-11 MED ORDER — VITAMIN D (ERGOCALCIFEROL) 1.25 MG (50000 UNIT) PO CAPS: 50000.0000 [IU] | ORAL_CAPSULE | ORAL | 1 refills | Status: AC

## 2022-01-11 NOTE — Progress Notes (Signed)
.  pap

## 2022-01-11 NOTE — Assessment & Plan Note (Signed)
Reports treatment failure with phentermine She notes eating healthy and walking daily Reports difficulty losing weight and want to be screened for PCOS Pt is currently on IUD contraceptive, and screening would be ineffective at this time Encouraged healthy lifestyle with increased physical activities

## 2022-01-11 NOTE — Progress Notes (Signed)
Established Patient Office Visit  Subjective:  Patient ID: Jody Warren, female    DOB: [birth year]  Age: 36 y.o. MRN: [id number]  CC:  Chief Complaint  Patient presents with   Follow-up    3 month f/u, still using phentermine doesn't feel that it has been helping much. Would like to be checked for PCOS.    HPI Jody Warren is a 36 y.o. female with past medical history of  GERD presents for f/u of  chronic medical conditions. Obesity: reports treatment failure with phentermine. She notes to eating healthy and walking daily. Reports difficulty loosing weight and want to be screen for PCOS. Abnormal pap: She reported positive HPV on her pap smear last year, with a repeat pap smear this year. The patient had a  positive HPV with a repeat pap smear this year.  Past Medical History:  Diagnosis Date   Asthma since birth    no current meds   GERD (gastroesophageal reflux disease)    History of kidney stones    Kidney stone    Obesity     Past Surgical History:  Procedure Laterality Date   CESAREAN SECTION N/A 12/07/2014   Procedure: CESAREAN SECTION;  Surgeon: Mora Bellman, MD;  Location: [facility];  Service: Obstetrics;  Laterality: N/A;   CHOLECYSTECTOMY N/A 11/17/2018   Procedure: LAPAROSCOPIC CHOLECYSTECTOMY;  Surgeon: Aviva Signs, MD;  Location: [facility];  Service: General;  Laterality: N/A;    Family History  Problem Relation Age of Onset   Hyperlipidemia Mother    Hypertension Father    Diabetes Father    Cancer Maternal Grandfather    Colon cancer Neg Hx    Colon polyps Neg Hx     Social History   Socioeconomic History   Marital status: Single    Spouse name: Not on file   Number of children: 1   Years of education: Not on file   Highest education level: Not on file  Occupational History   Occupation: Therapist, art Rep at [employer]   Smoking status: Some Days    Packs/day: 0.25    Years: 12.00    Total pack years: 3.00    Types:  Cigarettes   Smokeless tobacco: Never   Tobacco comments:    smokes 2 cig qother day.  Vaping Use   Vaping Use: Never used  Substance and Sexual Activity   Alcohol use: Yes    Comment: wine about once a month   Drug use: Never   Sexual activity: Not Currently    Birth control/protection: None  Other Topics Concern   Not on file  Social History Narrative   Not on file   Social Determinants of Health   Financial Resource Strain: Not on file  Food Insecurity: Not on file  Transportation Needs: Not on file  Physical Activity: Not on file  Stress: Not on file  Social Connections: Not on file  Intimate Partner Violence: Not on file    Outpatient Medications Prior to Visit  Medication Sig Dispense Refill   famotidine (PEPCID) 10 MG tablet Take 10 mg by mouth as needed for heartburn or indigestion.     naproxen sodium (ALEVE) 220 MG tablet Take 220 mg by mouth daily as needed. (Patient not taking: Reported on 10/07/2021)     phentermine 37.5 MG capsule Take 1 capsule (37.5 mg total) by mouth every morning. 30 capsule 1   Probiotic Product (PROBIOTIC PO) Take 2 tablets by mouth daily. (  Patient not taking: Reported on 10/07/2021)     Vitamin D, Ergocalciferol, (DRISDOL) 1.25 MG (50000 UNIT) CAPS capsule Take 1 capsule (50,000 Units total) by mouth every 7 (seven) days. 5 capsule 1   No facility-administered medications prior to visit.    Allergies  Allergen Reactions   Peanuts [Peanut Oil] Swelling    ROS Review of Systems  Constitutional:  Negative for fatigue and fever.  HENT:  Negative for sinus pressure, sinus pain and sore throat.   Eyes:  Negative for pain and redness.  Respiratory:  Negative for chest tightness and shortness of breath.   Cardiovascular:  Negative for chest pain and palpitations.  Gastrointestinal:  Negative for diarrhea, nausea and vomiting.  Endocrine: Negative for polydipsia, polyphagia and polyuria.  Genitourinary:  Negative for frequency and  urgency.      Objective:    Physical Exam Constitutional:      Appearance: She is obese.  HENT:     Head: Normocephalic.  Cardiovascular:     Rate and Rhythm: Normal rate and regular rhythm.     Pulses: Normal pulses.     Heart sounds: Normal heart sounds.  Pulmonary:     Effort: Pulmonary effort is normal.     Breath sounds: Normal breath sounds.  Abdominal:     Palpations: Abdomen is soft.  Neurological:     Mental Status: She is alert and oriented to person, place, and time.     BP 114/76   Pulse 91   Ht 5' 5"  (1.651 m)   Wt (!) 305 lb (138.3 kg)   SpO2 97%   BMI 50.75 kg/m  Wt Readings from Last 3 Encounters:  01/11/22 (!) 305 lb (138.3 kg)  10/07/21 299 lb 12.8 oz (136 kg)  01/16/19 291 lb 3.2 oz (132.1 kg)    Lab Results  Component Value Date   TSH 1.410 10/19/2021   Lab Results  Component Value Date   WBC 8.3 10/19/2021   HGB 14.9 10/19/2021   HCT 45.5 10/19/2021   MCV 89 10/19/2021   PLT 331 10/19/2021   Lab Results  Component Value Date   NA 139 10/19/2021   K 4.4 10/19/2021   CO2 22 10/19/2021   GLUCOSE 84 10/19/2021   BUN 15 10/19/2021   CREATININE 0.85 10/19/2021   BILITOT 0.3 10/19/2021   ALKPHOS 65 10/19/2021   AST 22 10/19/2021   ALT 19 10/19/2021   PROT 6.9 10/19/2021   ALBUMIN 4.1 10/19/2021   CALCIUM 9.0 10/19/2021   ANIONGAP 7 11/14/2018   EGFR 92 10/19/2021   Lab Results  Component Value Date   CHOL 131 10/19/2021   Lab Results  Component Value Date   HDL 49 10/19/2021   Lab Results  Component Value Date   LDLCALC 69 10/19/2021   Lab Results  Component Value Date   TRIG 61 10/19/2021   Lab Results  Component Value Date   CHOLHDL 2.7 10/19/2021   Lab Results  Component Value Date   HGBA1C 5.7 (H) 10/19/2021      Assessment & Plan:   Problem List Items Addressed This Visit       Other   OBESITY, UNSPECIFIED    Reports treatment failure with phentermine She notes eating healthy and walking  daily Reports difficulty losing weight and want to be screened for PCOS Pt is currently on IUD contraceptive, and screening would be ineffective at this time Encouraged healthy lifestyle with increased physical activities  Abnormal Papanicolaou smear of cervix with positive human papilloma virus (HPV) test - Primary    She reported positive HPV on her pap smear last year, with a repeat pap smear this year The patient had a  positive HPV with a repeat pap smear this year Referral placed to GYN for a colposcopy      Relevant Orders   Ambulatory referral to Gynecology   Other Visit Diagnoses     Vitamin D deficiency       Relevant Medications   Vitamin D, Ergocalciferol, (DRISDOL) 1.25 MG (50000 UNIT) CAPS capsule       Meds ordered this encounter  Medications   Vitamin D, Ergocalciferol, (DRISDOL) 1.25 MG (50000 UNIT) CAPS capsule    Sig: Take 1 capsule (50,000 Units total) by mouth every 7 (seven) days.    Dispense:  5 capsule    Refill:  1    Follow-up: Return in about 3 months (around 04/13/2022).    Alvira Monday, FNP

## 2022-01-11 NOTE — Assessment & Plan Note (Signed)
She reported positive HPV on her pap smear last year, with a repeat pap smear this year The patient had a  positive HPV with a repeat pap smear this year Referral placed to GYN for a colposcopy

## 2022-01-11 NOTE — Patient Instructions (Addendum)
I appreciate the opportunity to provide care to you today!    Follow up:  3 months  Referral: GYN for a colposcopy   Please continue to a heart-healthy diet and increase your physical activities. Try to exercise for at least three times a week.      It was a pleasure to see you and I look forward to continuing to work together on your health and well-being. Please do not hesitate to call the office if you need care or have questions about your care.   Have a wonderful day and week. With Gratitude, Gilmore Laroche MSN, FNP-BC

## 2022-01-26 ENCOUNTER — Other Ambulatory Visit: Payer: Self-pay

## 2022-01-26 ENCOUNTER — Encounter (HOSPITAL_COMMUNITY): Payer: Self-pay | Admitting: Emergency Medicine

## 2022-01-26 ENCOUNTER — Emergency Department (HOSPITAL_COMMUNITY): Payer: BC Managed Care – PPO

## 2022-01-26 ENCOUNTER — Emergency Department (HOSPITAL_COMMUNITY)
Admission: EM | Admit: 2022-01-26 | Discharge: 2022-01-26 | Disposition: A | Discharge status: Home / Self Care | Payer: BC Managed Care – PPO | Attending: Emergency Medicine | Admitting: Emergency Medicine

## 2022-01-26 DIAGNOSIS — R1031 Right lower quadrant pain: Secondary | ICD-10-CM | POA: Diagnosis not present

## 2022-01-26 DIAGNOSIS — R109 Unspecified abdominal pain: Secondary | ICD-10-CM

## 2022-01-26 DIAGNOSIS — R197 Diarrhea, unspecified: Secondary | ICD-10-CM | POA: Insufficient documentation

## 2022-01-26 DIAGNOSIS — Z91010 Allergy to peanuts: Secondary | ICD-10-CM | POA: Insufficient documentation

## 2022-01-26 DIAGNOSIS — K5730 Diverticulosis of large intestine without perforation or abscess without bleeding: Secondary | ICD-10-CM | POA: Diagnosis not present

## 2022-01-26 DIAGNOSIS — J45909 Unspecified asthma, uncomplicated: Secondary | ICD-10-CM | POA: Diagnosis not present

## 2022-01-26 DIAGNOSIS — N760 Acute vaginitis: Secondary | ICD-10-CM | POA: Diagnosis not present

## 2022-01-26 DIAGNOSIS — Z87442 Personal history of urinary calculi: Secondary | ICD-10-CM | POA: Diagnosis not present

## 2022-01-26 DIAGNOSIS — K429 Umbilical hernia without obstruction or gangrene: Secondary | ICD-10-CM | POA: Diagnosis not present

## 2022-01-26 DIAGNOSIS — Z9049 Acquired absence of other specified parts of digestive tract: Secondary | ICD-10-CM | POA: Diagnosis not present

## 2022-01-26 LAB — COMPREHENSIVE METABOLIC PANEL
ALT: 17 U/L (ref 0–44)
AST: 18 U/L (ref 15–41)
Albumin: 3.5 g/dL (ref 3.5–5.0)
Alkaline Phosphatase: 48 U/L (ref 38–126)
Anion gap: 6 (ref 5–15)
BUN: 11 mg/dL (ref 6–20)
CO2: 21 mmol/L — ABNORMAL LOW (ref 22–32)
Calcium: 8.2 mg/dL — ABNORMAL LOW (ref 8.9–10.3)
Chloride: 109 mmol/L (ref 98–111)
Creatinine, Ser: 0.78 mg/dL (ref 0.44–1.00)
GFR, Estimated: >60 mL/min (ref >60)
Glucose, Bld: 87 mg/dL (ref 70–99)
Potassium: 3.3 mmol/L — ABNORMAL LOW (ref 3.5–5.1)
Sodium: 136 mmol/L (ref 135–145)
Total Bilirubin: 0.5 mg/dL (ref 0.3–1.2)
Total Protein: 6.8 g/dL (ref 6.5–8.1)

## 2022-01-26 LAB — URINALYSIS, ROUTINE W REFLEX MICROSCOPIC
Bilirubin Urine: NEGATIVE
Glucose, UA: NEGATIVE mg/dL
Hgb urine dipstick: NEGATIVE
Ketones, ur: NEGATIVE mg/dL
Leukocytes,Ua: NEGATIVE
Nitrite: NEGATIVE
Protein, ur: 30 mg/dL — AB
Specific Gravity, Urine: 1.031 — ABNORMAL HIGH (ref 1.005–1.030)
pH: 5 (ref 5.0–8.0)

## 2022-01-26 LAB — CBC
HCT: 40.7 % (ref 36.0–46.0)
Hemoglobin: 13.5 g/dL (ref 12.0–15.0)
MCH: 29.4 pg (ref 26.0–34.0)
MCHC: 33.2 g/dL (ref 30.0–36.0)
MCV: 88.7 fL (ref 80.0–100.0)
Platelets: 269 10*3/uL (ref 150–400)
RBC: 4.59 MIL/uL (ref 3.87–5.11)
RDW: 14.1 % (ref 11.5–15.5)
WBC: 8.9 10*3/uL (ref 4.0–10.5)
nRBC: 0 % (ref 0.0–0.2)

## 2022-01-26 LAB — LIPASE, BLOOD: Lipase: 23 U/L (ref 11–51)

## 2022-01-26 LAB — PREGNANCY, URINE: Preg Test, Ur: NEGATIVE

## 2022-01-26 NOTE — ED Provider Notes (Signed)
[facility] EMERGENCY DEPARTMENT Provider Note   CSN: [id number] Arrival date & time: 01/26/22  1115     History  Chief Complaint  Patient presents with   Abdominal Pain    Jody Warren is a 36 y.o. female with a history including GERD, asthma, distant history of kidney stone, surgical history significant for cholecystectomy presenting for evaluation of right flank pain which started yesterday.  She describes sudden onset of pain in her right flank which over the course of the evening radiated around to her right lower abdomen, to the midline and was associated with several episodes of nonbloody diarrhea, also reports darker than normal urine.  She took a dose of ibuprofen at 7 AM this morning and shortly after that her pain has resolved.  She had had no nausea or vomiting with this pain.  Her pain was constant, sharp and stabbing in character until it resolved.  She is unsure if this reminds her of her last kidney stone passage as that occurred many years ago. She is currently symptom-free.  The history is provided by the patient.       Home Medications Prior to Admission medications   Medication Sig Start Date End Date Taking? Authorizing Provider  famotidine (PEPCID) 10 MG tablet Take 10 mg by mouth as needed for heartburn or indigestion.    [provider]  naproxen sodium (ALEVE) 220 MG tablet Take 220 mg by mouth daily as needed. Patient not taking: Reported on 10/07/2021    [provider]  phentermine 37.5 MG capsule Take 1 capsule (37.5 mg total) by mouth every morning. 10/20/21   Gilmore Laroche, FNP  Probiotic Product (PROBIOTIC PO) Take 2 tablets by mouth daily. Patient not taking: Reported on 10/07/2021    [provider]  Vitamin D, Ergocalciferol, (DRISDOL) 1.25 MG (50000 UNIT) CAPS capsule Take 1 capsule (50,000 Units total) by mouth every 7 (seven) days. 01/11/22   Gilmore Laroche, FNP      Allergies    Peanuts [peanut oil]    Review  of Systems   Review of Systems  Constitutional:  Negative for chills and fever.  HENT: Negative.    Eyes: Negative.   Respiratory:  Negative for chest tightness and shortness of breath.   Cardiovascular:  Negative for chest pain.  Gastrointestinal:  Positive for abdominal pain and diarrhea. Negative for nausea and vomiting.  Genitourinary:  Positive for flank pain. Negative for decreased urine volume, dysuria, hematuria and urgency.  Musculoskeletal:  Negative for arthralgias, joint swelling and neck pain.  Skin: Negative.  Negative for rash and wound.  Neurological:  Negative for dizziness, weakness, light-headedness, numbness and headaches.  Psychiatric/Behavioral: Negative.    All other systems reviewed and are negative.   Physical Exam Updated Vital Signs BP 104/69 (BP Location: Right Arm)   Pulse 70   Temp 98.1 F (36.7 C) (Oral)   Resp 19   Ht 5\' 5"  (1.651 m)   Wt (!) 139.3 kg   SpO2 100%   BMI 51.09 kg/m  Physical Exam Vitals and nursing note reviewed.  Constitutional:      Appearance: She is well-developed.  HENT:     Head: Normocephalic and atraumatic.  Eyes:     Conjunctiva/sclera: Conjunctivae normal.  Cardiovascular:     Rate and Rhythm: Normal rate and regular rhythm.     Heart sounds: Normal heart sounds.  Pulmonary:     Effort: Pulmonary effort is normal.     Breath sounds: Normal breath  sounds. No wheezing.  Abdominal:     General: Bowel sounds are normal.     Palpations: Abdomen is soft.     Tenderness: There is no abdominal tenderness. There is no guarding or rebound.  Musculoskeletal:        General: Normal range of motion.     Cervical back: Normal range of motion.  Skin:    General: Skin is warm and dry.  Neurological:     General: No focal deficit present.     Mental Status: She is alert.     ED Results / Procedures / Treatments   Labs (all labs ordered are listed, but only abnormal results are displayed) Labs Reviewed  COMPREHENSIVE  METABOLIC PANEL - Abnormal; Notable for the following components:      Result Value   Potassium 3.3 (*)    CO2 21 (*)    Calcium 8.2 (*)    All other components within normal limits  URINALYSIS, ROUTINE W REFLEX MICROSCOPIC - Abnormal; Notable for the following components:   Color, Urine AMBER (*)    Specific Gravity, Urine 1.031 (*)    Protein, ur 30 (*)    Bacteria, UA RARE (*)    All other components within normal limits  LIPASE, BLOOD  CBC  PREGNANCY, URINE    EKG None  Radiology CT Renal Stone Study  Result Date: 01/26/2022 CLINICAL DATA:  Flank pain. EXAM: CT ABDOMEN AND PELVIS WITHOUT CONTRAST TECHNIQUE: Multidetector CT imaging of the abdomen and pelvis was performed following the standard protocol without IV contrast. RADIATION DOSE REDUCTION: This exam was performed according to the departmental dose-optimization program which includes automated exposure control, adjustment of the mA and/or kV according to patient size and/or use of iterative reconstruction technique. COMPARISON:  CT abdomen and pelvis 11/14/2013 FINDINGS: Lower chest: No acute abnormality. Hepatobiliary: No focal liver abnormality is seen. Status post cholecystectomy. No biliary dilatation. Pancreas: Unremarkable. No pancreatic ductal dilatation or surrounding inflammatory changes. Spleen: Normal in size without focal abnormality. Adrenals/Urinary Tract: Adrenal glands are unremarkable. Kidneys are normal, without renal calculi, focal lesion, or hydronephrosis. Bladder is unremarkable. Stomach/Bowel: Stomach is within normal limits. Appendix appears normal. No evidence of bowel wall thickening, distention, or inflammatory changes. There is sigmoid colon diverticulosis. Vascular/Lymphatic: No significant vascular findings are present. No enlarged abdominal or pelvic lymph nodes. Reproductive: IUD in the uterus.  Ovaries are within normal limits. Other: Small fat containing umbilical hernia. No ascites or free air.  Musculoskeletal: No acute or significant osseous findings. IMPRESSION: 1. No acute localizing process in the abdomen or pelvis. 2. Sigmoid colon diverticulosis. 3. IUD in the uterus. 4. Cholecystectomy. Electronically Signed   By: Darliss Cheney M.D.   On: 01/26/2022 15:51    Procedures Procedures    Medications Ordered in ED Medications - No data to display  ED Course/ Medical Decision Making/ A&P                           Medical Decision Making Patient with right flank pain which radiated into her right suprapubic region prior to arrival but now completely asymptomatic.  Distant history of kidney stones.  Denies dysuria, fevers, abdominal distention, dysuria or vaginal complaints.  Differential diagnosis includes kidney stone/ureteral lithiasis, pyelonephritis, cystitis, mechanical back pain, other intra-abdominal infection or obstruction.  She has no right lower quadrant pain on exam, she remained symptom-free during the entirety of the visit.  Amount and/or Complexity of Data Reviewed Labs:  ordered.    Details: Labs are reassuring, she does have an elevated specific gravity in her urine, but no evidence of infection.  No hematuria. Radiology: ordered.    Details: CT imaging negative for acute abdominal process including kidney stones.  She does have diverticulosis but no diverticulitis.  Appendix was visualized and normal.  Risk Decision regarding hospitalization. Risk Details: No indication for hospitalization at this time.  Patient was given return precautions, otherwise plan as needed follow-up with her PCP.           Final Clinical Impression(s) / ED Diagnoses Final diagnoses:  Right lower quadrant abdominal pain  Flank pain    Rx / DC Orders ED Discharge Orders     None         Victoriano Lain 01/26/22 1708    Pricilla Loveless, MD 01/27/22 [id number]

## 2022-01-26 NOTE — ED Triage Notes (Signed)
Pt presence with back pain, RLQ abdominal pain and nausea x 1 day.

## 2022-02-16 ENCOUNTER — Encounter: Payer: Self-pay | Admitting: Family Medicine

## 2022-02-16 ENCOUNTER — Telehealth: Payer: Self-pay | Admitting: Family Medicine

## 2022-02-16 DIAGNOSIS — N6310 Unspecified lump in the right breast, unspecified quadrant: Secondary | ICD-10-CM | POA: Diagnosis not present

## 2022-02-16 DIAGNOSIS — L732 Hidradenitis suppurativa: Secondary | ICD-10-CM | POA: Diagnosis not present

## 2022-02-16 NOTE — Telephone Encounter (Signed)
Pt reports two boils under her breast that came on Saturday, states she works at [employer] and had a provider look at it, will send pics through my chart to get your opinion on what she can do.

## 2022-02-16 NOTE — Telephone Encounter (Signed)
Has she had these boils before and is it tender or painful?

## 2022-02-16 NOTE — Telephone Encounter (Signed)
Pt called stating she has another boil under her breast. She is able to send pictures. Patient wants to know if you can please give her a call?

## 2022-02-17 ENCOUNTER — Other Ambulatory Visit: Payer: Self-pay | Admitting: Family Medicine

## 2022-02-17 DIAGNOSIS — N611 Abscess of the breast and nipple: Secondary | ICD-10-CM

## 2022-02-17 MED ORDER — SULFAMETHOXAZOLE-TRIMETHOPRIM 800-160 MG PO TABS: 1.0000 | ORAL_TABLET | Freq: Two times a day (BID) | ORAL | 0 refills | Status: AC

## 2022-02-17 MED ORDER — METRONIDAZOLE 0.75 % EX GEL: 1.0000 | Freq: Two times a day (BID) | CUTANEOUS | 0 refills | Status: AC

## 2022-02-17 NOTE — Telephone Encounter (Signed)
A prescription has been sent for a skin abscess of the breast. Bactrim BID for seven days and  a topical antibiotic gel to apply to the affected area

## 2022-02-24 ENCOUNTER — Encounter: Payer: BC Managed Care – PPO | Admitting: Obstetrics & Gynecology

## 2022-03-26 ENCOUNTER — Encounter: Payer: BC Managed Care – PPO | Admitting: Obstetrics & Gynecology

## 2022-04-14 ENCOUNTER — Ambulatory Visit: Payer: BC Managed Care – PPO | Admitting: Family Medicine

## 2022-04-15 DIAGNOSIS — R309 Painful micturition, unspecified: Secondary | ICD-10-CM | POA: Diagnosis not present

## 2022-04-15 DIAGNOSIS — A599 Trichomoniasis, unspecified: Secondary | ICD-10-CM | POA: Diagnosis not present

## 2022-04-15 DIAGNOSIS — Z113 Encounter for screening for infections with a predominantly sexual mode of transmission: Secondary | ICD-10-CM | POA: Diagnosis not present

## 2022-04-15 DIAGNOSIS — R8290 Unspecified abnormal findings in urine: Secondary | ICD-10-CM | POA: Diagnosis not present

## 2022-04-20 ENCOUNTER — Encounter: Payer: Self-pay | Admitting: Obstetrics & Gynecology

## 2022-04-20 DIAGNOSIS — Z539 Procedure and treatment not carried out, unspecified reason: Secondary | ICD-10-CM

## 2022-05-14 ENCOUNTER — Ambulatory Visit: Payer: BC Managed Care – PPO | Admitting: Family Medicine

## 2022-07-15 DIAGNOSIS — Z713 Dietary counseling and surveillance: Secondary | ICD-10-CM | POA: Diagnosis not present

## 2022-07-15 DIAGNOSIS — A599 Trichomoniasis, unspecified: Secondary | ICD-10-CM | POA: Diagnosis not present

## 2022-07-15 DIAGNOSIS — Z30432 Encounter for removal of intrauterine contraceptive device: Secondary | ICD-10-CM | POA: Diagnosis not present

## 2022-07-15 DIAGNOSIS — Z789 Other specified health status: Secondary | ICD-10-CM | POA: Diagnosis not present

## 2022-08-25 DIAGNOSIS — Z3009 Encounter for other general counseling and advice on contraception: Secondary | ICD-10-CM | POA: Diagnosis not present

## 2022-08-25 DIAGNOSIS — I10 Essential (primary) hypertension: Secondary | ICD-10-CM | POA: Diagnosis not present

## 2022-08-25 DIAGNOSIS — Z713 Dietary counseling and surveillance: Secondary | ICD-10-CM | POA: Diagnosis not present

## 2022-09-21 DIAGNOSIS — Z6841 Body Mass Index (BMI) 40.0 and over, adult: Secondary | ICD-10-CM | POA: Diagnosis not present

## 2022-09-21 DIAGNOSIS — R030 Elevated blood-pressure reading, without diagnosis of hypertension: Secondary | ICD-10-CM | POA: Diagnosis not present

## 2022-09-21 DIAGNOSIS — R300 Dysuria: Secondary | ICD-10-CM | POA: Diagnosis not present

## 2022-09-22 DIAGNOSIS — I10 Essential (primary) hypertension: Secondary | ICD-10-CM | POA: Diagnosis not present

## 2022-09-22 DIAGNOSIS — R300 Dysuria: Secondary | ICD-10-CM | POA: Diagnosis not present

## 2022-09-22 DIAGNOSIS — Z8619 Personal history of other infectious and parasitic diseases: Secondary | ICD-10-CM | POA: Diagnosis not present

## 2023-01-07 DIAGNOSIS — Z713 Dietary counseling and surveillance: Secondary | ICD-10-CM | POA: Diagnosis not present

## 2023-01-07 DIAGNOSIS — N926 Irregular menstruation, unspecified: Secondary | ICD-10-CM | POA: Diagnosis not present

## 2023-03-04 DIAGNOSIS — Z713 Dietary counseling and surveillance: Secondary | ICD-10-CM | POA: Diagnosis not present

## 2023-04-19 DIAGNOSIS — U071 COVID-19: Secondary | ICD-10-CM | POA: Diagnosis not present

## 2023-07-19 ENCOUNTER — Ambulatory Visit: Attending: Otolaryngology

## 2023-07-19 DIAGNOSIS — G4733 Obstructive sleep apnea (adult) (pediatric): Secondary | ICD-10-CM | POA: Diagnosis present

## 2023-08-15 ENCOUNTER — Encounter (HOSPITAL_COMMUNITY): Payer: Self-pay

## 2023-08-15 ENCOUNTER — Emergency Department (HOSPITAL_COMMUNITY)
Admission: EM | Admit: 2023-08-15 | Discharge: 2023-08-15 | Disposition: A | Discharge status: Home / Self Care | Attending: Emergency Medicine | Admitting: Emergency Medicine

## 2023-08-15 ENCOUNTER — Other Ambulatory Visit: Payer: Self-pay

## 2023-08-15 DIAGNOSIS — Z91010 Allergy to peanuts: Secondary | ICD-10-CM | POA: Diagnosis not present

## 2023-08-15 DIAGNOSIS — L02415 Cutaneous abscess of right lower limb: Secondary | ICD-10-CM | POA: Insufficient documentation

## 2023-08-15 DIAGNOSIS — L0291 Cutaneous abscess, unspecified: Secondary | ICD-10-CM

## 2023-08-15 MED ORDER — SULFAMETHOXAZOLE-TRIMETHOPRIM 800-160 MG PO TABS: 1.0000 | ORAL_TABLET | Freq: Two times a day (BID) | ORAL | 0 refills | Status: AC

## 2023-08-15 MED ORDER — LIDOCAINE-EPINEPHRINE (PF) 2 %-1:200000 IJ SOLN
20.0000 mL | Freq: Once | INTRAMUSCULAR | Status: AC
  Administered 2023-08-15: 20 mL via INTRADERMAL
  Filled 2023-08-15: qty 20

## 2023-08-15 MED ORDER — SULFAMETHOXAZOLE-TRIMETHOPRIM 800-160 MG PO TABS
1.0000 | ORAL_TABLET | Freq: Once | ORAL | Status: AC
  Administered 2023-08-15: 1 via ORAL
  Filled 2023-08-15: qty 1

## 2023-08-15 NOTE — Discharge Instructions (Signed)
 As discussed, take Bactrim twice a day for the next week.  It will take about 2 days for the antibiotics again and then you should start experiencing relief.  For any pain or discomfort you can alternate between Ibuprofen and Tylenol every 4 hours as needed.  This will help prevent any infections from the abscess site.  You can clean the area with soap and water.  It is normal if it continues to drain over the past couple days.  Follow-up with your primary care provider in the next week for reevaluation.  Get help right away if: You see red streaks on your skin near the abscess. You have any signs of worse infection. You vomit every time you eat or drink. You have a fever, chills, or muscle aches.

## 2023-08-15 NOTE — ED Provider Notes (Signed)
 [place] EMERGENCY DEPARTMENT AT [facility] Provider Note   CSN: [id number] Arrival date & time: 08/15/23  2952     History  Chief Complaint  Patient presents with   Abscess    Jody Warren is a 38 y.o. female with no significant past medical history presents the ED today for an abscess.  Patient reports painful abscess to the right medial thigh that has been growing in size for the past 5 days.  She has been putting over-the-counter salve admits on the abscess with some improvement.  States that the abscess began to drain some yesterday but still has a lot of pain.  She went to urgent care and they advised her to come to the ER for further evaluation.  She endorses some redness around the abscess site with warmth to touch.  No associated fevers.  Denies any other complaints or concerns at this time.    Home Medications Prior to Admission medications   Medication Sig Start Date End Date Taking? Authorizing Provider  sulfamethoxazole-trimethoprim (BACTRIM DS) 800-160 MG tablet Take 1 tablet by mouth 2 (two) times daily for 7 days. 08/15/23 08/22/23 Yes Maxwell Marion, PA-C  famotidine (PEPCID) 10 MG tablet Take 10 mg by mouth as needed for heartburn or indigestion.    [provider]  metroNIDAZOLE (METROGEL) 0.75 % gel Apply 1 Application topically 2 (two) times daily. 02/17/22   Gilmore Laroche, FNP  naproxen sodium (ALEVE) 220 MG tablet Take 220 mg by mouth daily as needed. Patient not taking: Reported on 10/07/2021    [provider]  phentermine 37.5 MG capsule Take 1 capsule (37.5 mg total) by mouth every morning. 10/20/21   Gilmore Laroche, FNP  Probiotic Product (PROBIOTIC PO) Take 2 tablets by mouth daily. Patient not taking: Reported on 10/07/2021    [provider]  Vitamin D, Ergocalciferol, (DRISDOL) 1.25 MG (50000 UNIT) CAPS capsule Take 1 capsule (50,000 Units total) by mouth every 7 (seven) days. 01/11/22   Gilmore Laroche, FNP       Allergies    Peanuts [peanut oil]    Review of Systems   Review of Systems  Skin:  Positive for wound.  All other systems reviewed and are negative.   Physical Exam Updated Vital Signs BP 125/70 (BP Location: Right Arm)   Pulse 93   Temp 97.8 F (36.6 C) (Temporal)   Resp 18   Ht 5\' 5"  (1.651 m)   Wt (!) 140 kg   SpO2 99%   BMI 51.36 kg/m  Physical Exam Vitals and nursing note reviewed.  Constitutional:      Appearance: Normal appearance.  HENT:     Head: Normocephalic and atraumatic.     Mouth/Throat:     Mouth: Mucous membranes are moist.  Eyes:     Conjunctiva/sclera: Conjunctivae normal.     Pupils: Pupils are equal, round, and reactive to light.  Cardiovascular:     Rate and Rhythm: Normal rate and regular rhythm.     Pulses: Normal pulses.  Pulmonary:     Effort: Pulmonary effort is normal.     Breath sounds: Normal breath sounds.  Abdominal:     Palpations: Abdomen is soft.     Tenderness: There is no abdominal tenderness.  Musculoskeletal:        General: Normal range of motion.     Cervical back: Normal range of motion.  Skin:    General: Skin is warm and dry.     Findings: No  rash.     Comments: Fluctuant abscess present on right medial thigh  Neurological:     General: No focal deficit present.     Mental Status: She is alert.  Psychiatric:        Mood and Affect: Mood normal.        Behavior: Behavior normal.    = ED Results / Procedures / Treatments   Labs (all labs ordered are listed, but only abnormal results are displayed) Labs Reviewed - No data to display  EKG None  Radiology No results found.  Procedures .Incision and Drainage  Date/Time: 08/15/2023 10:21 AM  Performed by: Maxwell Marion, PA-C Authorized by: Maxwell Marion, PA-C   Consent:    Consent obtained:  Verbal   Consent given by:  Patient   Risks discussed:  Bleeding, incomplete drainage and pain Universal protocol:    Patient identity confirmed:  Verbally  with patient Location:    Type:  Abscess   Location:  Lower extremity   Lower extremity location:  Leg   Leg location:  R upper leg Pre-procedure details:    Skin preparation:  Povidone-iodine Anesthesia:    Anesthesia method:  Local infiltration   Local anesthetic:  Lidocaine 1% WITH epi Procedure type:    Complexity:  Simple Procedure details:    Incision types:  Stab incision   Drainage:  Bloody   Drainage amount:  Scant   Wound treatment:  Wound left open Post-procedure details:    Procedure completion:  Tolerated well, no immediate complications     Medications Ordered in ED Medications  lidocaine-EPINEPHrine (XYLOCAINE W/EPI) 2 %-1:200000 (PF) injection 20 mL (has no administration in time range)  sulfamethoxazole-trimethoprim (BACTRIM DS) 800-160 MG per tablet 1 tablet (has no administration in time range)    ED Course/ Medical Decision Making/ A&P                                 Medical Decision Making Risk Prescription drug management.   This patient presents to the ED for concern of abscess, this involves an extensive number of treatment options, and is a complaint that carries with it a high risk of complications and morbidity.   Differential diagnosis includes: cellulitis, cyst, abscess, etc.   Comorbidities  No significant past medical history   Additional History  Additional history obtained from prior record   Problem List / ED Course / Critical Interventions / Medication Management  Patient has an abscess on the medial right thigh for the past 5 days.  She has been putting over-the-counter ointments on it has had some drainage.  She went to an urgent care and they advised her to come here for further evaluation due to size of abscess. Since the area where the drainage is small, I&D was performed with bloody drainage expressed.  Patient states that she is going to work after this so first dose of antibiotics given in the ED today and the rest was  sent to the pharmacy. I ordered medications including: Lidocaine for local anesthetic Bactrim given for infection prevention prior to discharge Reevaluation of the patient after these medicines showed that the patient improved I have reviewed the patients home medicines and have made adjustments as needed   Social Determinants of Health  Access to healthcare   Test / Admission - Considered  Patient is stable and safe for discharge home. Return precautions given.       Final  Clinical Impression(s) / ED Diagnoses Final diagnoses:  Abscess    Rx / DC Orders ED Discharge Orders          Ordered    sulfamethoxazole-trimethoprim (BACTRIM DS) 800-160 MG tablet  2 times daily        08/15/23 1014              Maxwell Marion, PA-C 08/15/23 1026    Rondel Baton, MD 08/19/23 1143

## 2023-08-15 NOTE — ED Triage Notes (Signed)
 Pt arrived via POV c/o abscess to inner right groin area. Pt reports first noticed it Thursday.

## 2023-12-29 ENCOUNTER — Encounter (HOSPITAL_COMMUNITY): Payer: Self-pay

## 2023-12-29 ENCOUNTER — Emergency Department (HOSPITAL_COMMUNITY)
Admission: EM | Admit: 2023-12-29 | Discharge: 2023-12-30 | Discharge status: Against Medical Advice | Attending: Emergency Medicine | Admitting: Emergency Medicine

## 2023-12-29 DIAGNOSIS — Z5321 Procedure and treatment not carried out due to patient leaving prior to being seen by health care provider: Secondary | ICD-10-CM | POA: Insufficient documentation

## 2023-12-29 DIAGNOSIS — R112 Nausea with vomiting, unspecified: Secondary | ICD-10-CM | POA: Diagnosis not present

## 2023-12-29 DIAGNOSIS — R1030 Lower abdominal pain, unspecified: Secondary | ICD-10-CM | POA: Insufficient documentation

## 2023-12-29 LAB — COMPREHENSIVE METABOLIC PANEL WITH GFR
ALT: 14 U/L (ref 0–44)
AST: 20 U/L (ref 15–41)
Albumin: 4.1 g/dL (ref 3.5–5.0)
Alkaline Phosphatase: 69 U/L (ref 38–126)
Anion gap: 12 (ref 5–15)
BUN: 15 mg/dL (ref 6–20)
CO2: 21 mmol/L — ABNORMAL LOW (ref 22–32)
Calcium: 9.2 mg/dL (ref 8.9–10.3)
Chloride: 105 mmol/L (ref 98–111)
Creatinine, Ser: 0.91 mg/dL (ref 0.44–1.00)
GFR, Estimated: >60 mL/min (ref >60)
Glucose, Bld: 114 mg/dL — ABNORMAL HIGH (ref 70–99)
Potassium: 3.2 mmol/L — ABNORMAL LOW (ref 3.5–5.1)
Sodium: 138 mmol/L (ref 135–145)
Total Bilirubin: 0.7 mg/dL (ref 0.0–1.2)
Total Protein: 8 g/dL (ref 6.5–8.1)

## 2023-12-29 LAB — CBC WITH DIFFERENTIAL/PLATELET
Abs Immature Granulocytes: 0.03 K/uL (ref 0.00–0.07)
Basophils Absolute: 0 K/uL (ref 0.0–0.1)
Basophils Relative: 0 %
Eosinophils Absolute: 0.3 K/uL (ref 0.0–0.5)
Eosinophils Relative: 3 %
HCT: 42.8 % (ref 36.0–46.0)
Hemoglobin: 14.3 g/dL (ref 12.0–15.0)
Immature Granulocytes: 0 %
Lymphocytes Relative: 21 %
Lymphs Abs: 2.2 K/uL (ref 0.7–4.0)
MCH: 28.1 pg (ref 26.0–34.0)
MCHC: 33.4 g/dL (ref 30.0–36.0)
MCV: 84.1 fL (ref 80.0–100.0)
Monocytes Absolute: 0.8 K/uL (ref 0.1–1.0)
Monocytes Relative: 8 %
Neutro Abs: 6.9 K/uL (ref 1.7–7.7)
Neutrophils Relative %: 68 %
Platelets: 399 K/uL (ref 150–400)
RBC: 5.09 MIL/uL (ref 3.87–5.11)
RDW: 15.9 % — ABNORMAL HIGH (ref 11.5–15.5)
WBC: 10.2 K/uL (ref 4.0–10.5)
nRBC: 0 % (ref 0.0–0.2)

## 2023-12-29 NOTE — ED Triage Notes (Addendum)
 Pt comes in for lower abd pain. Pt is wonder if her IUD is dislodged. Pt states she has started bleeding ever since pain started. Pain started around 1630 today. Iud has been placed since 11/08/2023. Pt has n/v as well.

## 2023-12-30 NOTE — ED Notes (Signed)
 Called x2 for room. Not present in lobby.

## 2023-12-31 ENCOUNTER — Emergency Department (HOSPITAL_COMMUNITY): Admission: EM | Admit: 2023-12-31 | Discharge: 2023-12-31 | Disposition: A | Discharge status: Home / Self Care

## 2023-12-31 ENCOUNTER — Encounter (HOSPITAL_COMMUNITY): Payer: Self-pay

## 2023-12-31 ENCOUNTER — Other Ambulatory Visit: Payer: Self-pay

## 2023-12-31 DIAGNOSIS — Z91010 Allergy to peanuts: Secondary | ICD-10-CM | POA: Insufficient documentation

## 2023-12-31 DIAGNOSIS — N3001 Acute cystitis with hematuria: Secondary | ICD-10-CM | POA: Insufficient documentation

## 2023-12-31 DIAGNOSIS — J45909 Unspecified asthma, uncomplicated: Secondary | ICD-10-CM | POA: Diagnosis not present

## 2023-12-31 DIAGNOSIS — R112 Nausea with vomiting, unspecified: Secondary | ICD-10-CM

## 2023-12-31 DIAGNOSIS — R1030 Lower abdominal pain, unspecified: Secondary | ICD-10-CM | POA: Diagnosis present

## 2023-12-31 LAB — URINALYSIS, ROUTINE W REFLEX MICROSCOPIC
Bilirubin Urine: NEGATIVE
Glucose, UA: NEGATIVE mg/dL
Ketones, ur: 20 mg/dL — AB
Nitrite: NEGATIVE
Protein, ur: 100 mg/dL — AB
RBC / HPF: >50 RBC/hpf (ref 0–5)
Specific Gravity, Urine: 1.02 (ref 1.005–1.030)
pH: 6 (ref 5.0–8.0)

## 2023-12-31 LAB — COMPREHENSIVE METABOLIC PANEL WITH GFR
ALT: 17 U/L (ref 0–44)
AST: 31 U/L (ref 15–41)
Albumin: 3.7 g/dL (ref 3.5–5.0)
Alkaline Phosphatase: 58 U/L (ref 38–126)
Anion gap: 13 (ref 5–15)
BUN: 15 mg/dL (ref 6–20)
CO2: 21 mmol/L — ABNORMAL LOW (ref 22–32)
Calcium: 8.7 mg/dL — ABNORMAL LOW (ref 8.9–10.3)
Chloride: 104 mmol/L (ref 98–111)
Creatinine, Ser: 0.94 mg/dL (ref 0.44–1.00)
GFR, Estimated: >60 mL/min (ref >60)
Glucose, Bld: 98 mg/dL (ref 70–99)
Potassium: 3.4 mmol/L — ABNORMAL LOW (ref 3.5–5.1)
Sodium: 138 mmol/L (ref 135–145)
Total Bilirubin: 0.8 mg/dL (ref 0.0–1.2)
Total Protein: 7.3 g/dL (ref 6.5–8.1)

## 2023-12-31 LAB — CBC WITH DIFFERENTIAL/PLATELET
Abs Immature Granulocytes: 0.05 K/uL (ref 0.00–0.07)
Basophils Absolute: 0 K/uL (ref 0.0–0.1)
Basophils Relative: 0 %
Eosinophils Absolute: 0 K/uL (ref 0.0–0.5)
Eosinophils Relative: 0 %
HCT: 43.6 % (ref 36.0–46.0)
Hemoglobin: 14.3 g/dL (ref 12.0–15.0)
Immature Granulocytes: 0 %
Lymphocytes Relative: 17 %
Lymphs Abs: 2.1 K/uL (ref 0.7–4.0)
MCH: 28 pg (ref 26.0–34.0)
MCHC: 32.8 g/dL (ref 30.0–36.0)
MCV: 85.5 fL (ref 80.0–100.0)
Monocytes Absolute: 0.7 K/uL (ref 0.1–1.0)
Monocytes Relative: 6 %
Neutro Abs: 9.1 K/uL — ABNORMAL HIGH (ref 1.7–7.7)
Neutrophils Relative %: 77 %
Platelets: 343 K/uL (ref 150–400)
RBC: 5.1 MIL/uL (ref 3.87–5.11)
RDW: 15.9 % — ABNORMAL HIGH (ref 11.5–15.5)
WBC: 11.9 K/uL — ABNORMAL HIGH (ref 4.0–10.5)
nRBC: 0 % (ref 0.0–0.2)

## 2023-12-31 LAB — LIPASE, BLOOD: Lipase: 28 U/L (ref 11–51)

## 2023-12-31 LAB — POC URINE PREG, ED: Preg Test, Ur: NEGATIVE

## 2023-12-31 MED ORDER — PANTOPRAZOLE SODIUM 40 MG IV SOLR
40.0000 mg | Freq: Once | INTRAVENOUS | Status: AC
Start: 2023-12-31 — End: 2023-12-31
  Administered 2023-12-31: 40 mg via INTRAVENOUS
  Filled 2023-12-31: qty 10

## 2023-12-31 MED ORDER — SODIUM CHLORIDE 0.9 % IV SOLN
1.0000 g | Freq: Once | INTRAVENOUS | Status: AC
  Administered 2023-12-31: 1 g via INTRAVENOUS
  Filled 2023-12-31: qty 10

## 2023-12-31 MED ORDER — METOCLOPRAMIDE HCL 10 MG PO TABS: 10.0000 mg | ORAL_TABLET | Freq: Four times a day (QID) | ORAL | 0 refills | Status: AC | PRN

## 2023-12-31 MED ORDER — CEPHALEXIN 500 MG PO CAPS: 500.0000 mg | ORAL_CAPSULE | Freq: Two times a day (BID) | ORAL | 0 refills | Status: AC

## 2023-12-31 MED ORDER — METOCLOPRAMIDE HCL 5 MG/ML IJ SOLN
10.0000 mg | Freq: Once | INTRAMUSCULAR | Status: AC
  Administered 2023-12-31: 10 mg via INTRAVENOUS
  Filled 2023-12-31: qty 2

## 2023-12-31 MED ORDER — ONDANSETRON HCL 4 MG/2ML IJ SOLN
4.0000 mg | Freq: Once | INTRAMUSCULAR | Status: AC | PRN
  Administered 2023-12-31: 4 mg via INTRAVENOUS
  Filled 2023-12-31: qty 2

## 2023-12-31 NOTE — ED Provider Notes (Signed)
 [place] [facility] AT [facility] Provider Note   CSN: [id number] Arrival date & time: 12/31/23  1135     Patient presents with: Nausea, Emesis, Abdominal Pain, and Back Pain   Jody Warren is a 38 y.o. female with a history significant for asthma, history of kidney stones, GERD, surgical history significant for cholecystectomy presenting for evaluation of nausea and vomiting along with lower abdominal pain with radiation into her lower back.  She has also noted blood-tinged urine but denies dysuria or urinary frequency.  She does have a history of frequent UTIs which she states is similar to today's symptoms.  She was seen at [facility] ER yesterday and underwent a full evaluation including lab testing and CT of abdomen and pelvis with no diagnosis but was sent home with an antacid and prescription for nausea.  Apparently the prescriptions were sent to the wrong pharmacy so she was unable to get her medicines picked up.  In the interim she is having increased nausea and vomiting.  She denies fevers or chills, has had no abdominal distention, denies constipation or diarrhea..  She describes a constant cramping sensation in her lower suprapubic region.  Denies vaginal discharge.  She does endorse having chronic vaginal spotting sinceIUD placement in June.  {Add pertinent medical, surgical, social history, OB history to YEP:67052} The history is provided by the patient.       Prior to Admission medications   Medication Sig Start Date End Date Taking? Authorizing Provider  cephALEXin  (KEFLEX ) 500 MG capsule Take 1 capsule (500 mg total) by mouth 2 (two) times daily for 7 days. 12/31/23 01/07/24 Yes Timon Geissinger, PA-C  metoCLOPramide  (REGLAN ) 10 MG tablet Take 1 tablet (10 mg total) by mouth every 6 (six) hours as needed for nausea or vomiting. 12/31/23  Yes Kanon Colunga, PA-C  famotidine  (PEPCID ) 10 MG tablet Take 10 mg by mouth as needed for heartburn or indigestion.    [provider]  metroNIDAZOLE  (METROGEL ) 0.75 % gel Apply 1 Application topically 2 (two) times daily. 02/17/22   Zarwolo, Gloria, FNP  naproxen sodium (ALEVE) 220 MG tablet Take 220 mg by mouth daily as needed. Patient not taking: Reported on 10/07/2021    [provider]  phentermine  37.5 MG capsule Take 1 capsule (37.5 mg total) by mouth every morning. 10/20/21   Zarwolo, Gloria, FNP  Probiotic Product (PROBIOTIC PO) Take 2 tablets by mouth daily. Patient not taking: Reported on 10/07/2021    [provider]  Vitamin D , Ergocalciferol , (DRISDOL ) 1.25 MG (50000 UNIT) CAPS capsule Take 1 capsule (50,000 Units total) by mouth every 7 (seven) days. 01/11/22   Zarwolo, Gloria, FNP    Allergies: Peanuts [peanut oil]    Review of Systems  Constitutional:  Negative for chills and fever.  HENT:  Negative for congestion and sore throat.   Eyes: Negative.   Respiratory:  Negative for chest tightness and shortness of breath.   Cardiovascular:  Negative for chest pain.  Gastrointestinal:  Positive for abdominal pain, nausea and vomiting. Negative for abdominal distention, constipation and diarrhea.  Genitourinary:  Positive for hematuria. Negative for flank pain.  Musculoskeletal:  Positive for back pain. Negative for arthralgias, joint swelling and neck pain.  Skin: Negative.  Negative for rash and wound.  Neurological:  Negative for dizziness, weakness, light-headedness, numbness and headaches.  Psychiatric/Behavioral: Negative.      Updated Vital Signs BP 130/66   Pulse 66   Temp 97.8 F (36.6 C) (Oral)  Resp 16   Ht 5' 5 (1.651 m)   Wt 133.4 kg   LMP  (LMP Unknown)   SpO2 100%   BMI 48.92 kg/m   Physical Exam Vitals and nursing note reviewed.  Constitutional:      Appearance: She is well-developed.  HENT:     Head: Normocephalic and atraumatic.  Eyes:     Conjunctiva/sclera: Conjunctivae normal.  Cardiovascular:     Rate and Rhythm: Normal rate and regular  rhythm.     Heart sounds: Normal heart sounds.  Pulmonary:     Effort: Pulmonary effort is normal. No respiratory distress.     Breath sounds: Normal breath sounds. No wheezing.  Abdominal:     General: Abdomen is protuberant. Bowel sounds are normal.     Palpations: Abdomen is soft.     Tenderness: There is abdominal tenderness in the suprapubic area. There is no right CVA tenderness, left CVA tenderness or guarding.  Musculoskeletal:        General: Normal range of motion.     Cervical back: Normal range of motion.  Skin:    General: Skin is warm and dry.  Neurological:     General: No focal deficit present.     Mental Status: She is alert.     (all labs ordered are listed, but only abnormal results are displayed) Labs Reviewed  COMPREHENSIVE METABOLIC PANEL WITH GFR - Abnormal; Notable for the following components:      Result Value   Potassium 3.4 (*)    CO2 21 (*)    Calcium 8.7 (*)    All other components within normal limits  URINALYSIS, ROUTINE W REFLEX MICROSCOPIC - Abnormal; Notable for the following components:   APPearance CLOUDY (*)    Hgb urine dipstick LARGE (*)    Ketones, ur 20 (*)    Protein, ur 100 (*)    Leukocytes,Ua SMALL (*)    Bacteria, UA RARE (*)    All other components within normal limits  CBC WITH DIFFERENTIAL/PLATELET - Abnormal; Notable for the following components:   WBC 11.9 (*)    RDW 15.9 (*)    Neutro Abs 9.1 (*)    All other components within normal limits  LIPASE, BLOOD  POC URINE PREG, ED    EKG: EKG Interpretation Date/Time:  Saturday December 31 2023 12:30:37 EDT Ventricular Rate:  56 PR Interval:  148 QRS Duration:  80 QT Interval:  431 QTC Calculation: 416 R Axis:   63  Text Interpretation: Sinus rhythm Confirmed by Simon Rea [phone]) on 12/31/2023 12:35:28 PM  Radiology: Sleep Study Documents Result Date: 12/30/2023 Ordered by an unspecified provider.   {Document cardiac monitor, telemetry assessment procedure when  appropriate:32947} Procedures   Medications Ordered in the ED  ondansetron  (ZOFRAN ) injection 4 mg (4 mg Intravenous Given 12/31/23 1240)  pantoprazole  (PROTONIX ) injection 40 mg (40 mg Intravenous Given 12/31/23 1240)  metoCLOPramide  (REGLAN ) injection 10 mg (10 mg Intravenous Given 12/31/23 1511)  cefTRIAXone  (ROCEPHIN ) 1 g in sodium chloride  0.9 % 100 mL IVPB (0 g Intravenous Stopped 12/31/23 1659)      {Click here for ABCD2, HEART and other calculators REFRESH Note before signing:1}                              Medical Decision Making Amount and/or Complexity of Data Reviewed Labs: ordered.  Risk Prescription drug management.     {Document critical care time when appropriate  Document review of labs and clinical decision tools ie CHADS2VASC2, etc  Document your independent review of radiology images and any outside records  Document your discussion with family members, caretakers and with consultants  Document social determinants of health affecting pt's care  Document your decision making why or why not admission, treatments were needed:32947:::1}   Final diagnoses:  Acute cystitis with hematuria  Nausea and vomiting, unspecified vomiting type    ED Discharge Orders          Ordered    metoCLOPramide  (REGLAN ) 10 MG tablet  Every 6 hours PRN        12/31/23 1737    cephALEXin  (KEFLEX ) 500 MG capsule  2 times daily        12/31/23 1737

## 2023-12-31 NOTE — ED Triage Notes (Addendum)
 Pt bib ems from for Nausea and vomiting, symptoms started on Thursday, pt seen at [facility] but wasn't able to get meds due to wrong pharmacy. Pt endorses abdo pain, back pain and pelvic pain. Pt also endorse mild spotting on blood with urination. Pt states that she takes zepbound. Pt also states that she got an IUD placed in June and has been bleeding since then.

## 2023-12-31 NOTE — Discharge Instructions (Signed)
 Continue your Reglan  to avoid nausea and vomiting, make sure you are drinking plenty of fluids to maintain hydration.  Take the entire course of the antibiotic prescribed with your first Keflex  tablet tomorrow morning as we have covered you with an IV dose of antibiotics here.  Get rechecked immediately if you develop worsening symptoms including fevers, uncontrolled nausea or vomiting or any other new or worsened symptoms.

## 2024-05-08 ENCOUNTER — Encounter (HOSPITAL_COMMUNITY): Payer: Self-pay | Admitting: Emergency Medicine

## 2024-05-08 ENCOUNTER — Other Ambulatory Visit: Payer: Self-pay

## 2024-05-08 ENCOUNTER — Ambulatory Visit: Payer: Self-pay

## 2024-05-08 ENCOUNTER — Emergency Department (HOSPITAL_COMMUNITY)
Admission: EM | Admit: 2024-05-08 | Discharge: 2024-05-08 | Disposition: A | Discharge status: Home / Self Care | Source: Ambulatory Visit | Attending: Emergency Medicine | Admitting: Emergency Medicine

## 2024-05-08 DIAGNOSIS — E876 Hypokalemia: Secondary | ICD-10-CM

## 2024-05-08 DIAGNOSIS — R55 Syncope and collapse: Secondary | ICD-10-CM

## 2024-05-08 LAB — COMPREHENSIVE METABOLIC PANEL WITH GFR
ALT: 7 U/L (ref 0–44)
AST: 14 U/L — ABNORMAL LOW (ref 15–41)
Albumin: 4.3 g/dL (ref 3.5–5.0)
Alkaline Phosphatase: 83 U/L (ref 38–126)
Anion gap: 16 — ABNORMAL HIGH (ref 5–15)
BUN: 14 mg/dL (ref 6–20)
CO2: 23 mmol/L (ref 22–32)
Calcium: 9.6 mg/dL (ref 8.9–10.3)
Chloride: 100 mmol/L (ref 98–111)
Creatinine, Ser: 0.82 mg/dL (ref 0.44–1.00)
GFR, Estimated: >60 mL/min (ref >60)
Glucose, Bld: 91 mg/dL (ref 70–99)
Potassium: 3.2 mmol/L — ABNORMAL LOW (ref 3.5–5.1)
Sodium: 138 mmol/L (ref 135–145)
Total Bilirubin: 0.5 mg/dL (ref 0.0–1.2)
Total Protein: 7.9 g/dL (ref 6.5–8.1)

## 2024-05-08 LAB — URINALYSIS, ROUTINE W REFLEX MICROSCOPIC
Bilirubin Urine: NEGATIVE
Glucose, UA: NEGATIVE mg/dL
Ketones, ur: NEGATIVE mg/dL
Leukocytes,Ua: NEGATIVE
Nitrite: NEGATIVE
Protein, ur: NEGATIVE mg/dL
Specific Gravity, Urine: 1.023 (ref 1.005–1.030)
pH: 5 (ref 5.0–8.0)

## 2024-05-08 LAB — CBC
HCT: 41.4 % (ref 36.0–46.0)
Hemoglobin: 14 g/dL (ref 12.0–15.0)
MCH: 28.6 pg (ref 26.0–34.0)
MCHC: 33.8 g/dL (ref 30.0–36.0)
MCV: 84.5 fL (ref 80.0–100.0)
Platelets: 361 K/uL (ref 150–400)
RBC: 4.9 MIL/uL (ref 3.87–5.11)
RDW: 14.4 % (ref 11.5–15.5)
WBC: 16.9 K/uL — ABNORMAL HIGH (ref 4.0–10.5)
nRBC: 0 % (ref 0.0–0.2)

## 2024-05-08 LAB — POC URINE PREG, ED: Preg Test, Ur: NEGATIVE

## 2024-05-08 LAB — CBG MONITORING, ED: Glucose-Capillary: 107 mg/dL — ABNORMAL HIGH (ref 70–99)

## 2024-05-08 MED ORDER — DOXYCYCLINE HYCLATE 100 MG PO CAPS: 100.0000 mg | ORAL_CAPSULE | Freq: Two times a day (BID) | ORAL | 0 refills | Status: AC

## 2024-05-08 MED ORDER — POTASSIUM CHLORIDE CRYS ER 20 MEQ PO TBCR: 20.0000 meq | EXTENDED_RELEASE_TABLET | Freq: Two times a day (BID) | ORAL | 0 refills | Status: DC

## 2024-05-08 MED ORDER — DOXYCYCLINE HYCLATE 100 MG PO TABS
100.0000 mg | ORAL_TABLET | Freq: Once | ORAL | Status: AC
  Administered 2024-05-08: 100 mg via ORAL
  Filled 2024-05-08: qty 1

## 2024-05-08 MED ORDER — POTASSIUM CHLORIDE CRYS ER 20 MEQ PO TBCR
40.0000 meq | EXTENDED_RELEASE_TABLET | Freq: Once | ORAL | Status: AC
  Administered 2024-05-08: 40 meq via ORAL
  Filled 2024-05-08: qty 2

## 2024-05-08 NOTE — ED Provider Notes (Incomplete)
  EMERGENCY DEPARTMENT AT [facility] Provider Note   CSN: [id number] Arrival date & time: 05/08/24  8167     Patient presents with: Loss of Consciousness   Jody Warren is a 38 y.o. female with a history including asthma, GERD, and gets frequent skin infections and abscess of her groin and upper thighs who currently has an abscess in her right upper thigh which she had been applying an antibiotic ointment.  Today it opened and drained a significant amount of purulence as she was removing the Band-Aid.  She developed significant pain at the site, she became lightheaded, nauseous and recognized that she was going to pass out, tried to get down on the floor but ended up passing out striking her nose and chin on the floor.  Her father came to her aid and told her she was passed out for about 5 minutes.  This episode happened around 12 noon today.  Patient states she has a history of syncope during painful events.  She has mild soreness across the bridge of her nose and her inside lower lip which bled but has since resolved.  She denies headache, persistent nausea or vomiting, no dizziness, neck pain, confusion.  The abscess on her thigh feels much better now that has drained, she states she has very little pain at the site currently.    {Add pertinent medical, surgical, social history, OB history to YEP:67052} The history is provided by the patient.       Prior to Admission medications   Medication Sig Start Date End Date Taking? Authorizing Provider  doxycycline  (VIBRAMYCIN ) 100 MG capsule Take 1 capsule (100 mg total) by mouth 2 (two) times daily. 05/08/24  Yes Timo Hartwig, PA-C  potassium chloride  SA (KLOR-CON  M) 20 MEQ tablet Take 1 tablet (20 mEq total) by mouth 2 (two) times daily. 05/08/24  Yes Louanne Calvillo, PA-C  famotidine  (PEPCID ) 10 MG tablet Take 10 mg by mouth as needed for heartburn or indigestion.    [provider]  metoCLOPramide  (REGLAN ) 10 MG  tablet Take 1 tablet (10 mg total) by mouth every 6 (six) hours as needed for nausea or vomiting. 12/31/23   Khylin Gutridge, PA-C  metroNIDAZOLE  (METROGEL ) 0.75 % gel Apply 1 Application topically 2 (two) times daily. 02/17/22   Bacchus, Gloria Z, FNP  naproxen sodium (ALEVE) 220 MG tablet Take 220 mg by mouth daily as needed. Patient not taking: Reported on 10/07/2021    [provider]  phentermine  37.5 MG capsule Take 1 capsule (37.5 mg total) by mouth every morning. 10/20/21   Bacchus, Gloria Z, FNP  Probiotic Product (PROBIOTIC PO) Take 2 tablets by mouth daily. Patient not taking: Reported on 10/07/2021    [provider]  Vitamin D , Ergocalciferol , (DRISDOL ) 1.25 MG (50000 UNIT) CAPS capsule Take 1 capsule (50,000 Units total) by mouth every 7 (seven) days. 01/11/22   Bacchus, Meade PEDLAR, FNP    Allergies: Peanuts [peanut oil]    Review of Systems  Constitutional:  Negative for fever.  HENT:  Negative for congestion and sore throat.   Eyes: Negative.   Respiratory:  Negative for chest tightness and shortness of breath.   Cardiovascular:  Negative for chest pain.  Gastrointestinal:  Negative for abdominal pain and nausea.  Genitourinary: Negative.   Musculoskeletal:  Negative for arthralgias, joint swelling and neck pain.  Skin: Negative.  Negative for rash and wound.  Neurological:  Negative for dizziness, weakness, light-headedness, numbness and headaches.  Psychiatric/Behavioral:  Negative.      Updated Vital Signs BP (!) 110/55   Pulse 78   Temp 99 F (37.2 C)   Resp 17   Ht 5' 5 (1.651 m)   Wt 107.4 kg   LMP 04/25/2024 (Exact Date)   SpO2 100%   BMI 39.39 kg/m   Physical Exam  (all labs ordered are listed, but only abnormal results are displayed) Labs Reviewed  COMPREHENSIVE METABOLIC PANEL WITH GFR - Abnormal; Notable for the following components:      Result Value   Potassium 3.2 (*)    AST 14 (*)    Anion gap 16 (*)    All other components within  normal limits  CBC - Abnormal; Notable for the following components:   WBC 16.9 (*)    All other components within normal limits  URINALYSIS, ROUTINE W REFLEX MICROSCOPIC - Abnormal; Notable for the following components:   APPearance HAZY (*)    Hgb urine dipstick SMALL (*)    Bacteria, UA RARE (*)    All other components within normal limits  CBG MONITORING, ED - Abnormal; Notable for the following components:   Glucose-Capillary 107 (*)    All other components within normal limits  POC URINE PREG, ED    EKG: None  Radiology: No results found.  {Document cardiac monitor, telemetry assessment procedure when appropriate:32947} Procedures   Medications Ordered in the ED  doxycycline  (VIBRA -TABS) tablet 100 mg (has no administration in time range)  potassium chloride  SA (KLOR-CON  M) CR tablet 40 mEq (has no administration in time range)      {Click here for ABCD2, HEART and other calculators REFRESH Note before signing:1}                              Medical Decision Making Amount and/or Complexity of Data Reviewed Labs: ordered. ECG/medicine tests: ordered.  Risk Prescription drug management.   ***  {Document critical care time when appropriate  Document review of labs and clinical decision tools ie CHADS2VASC2, etc  Document your independent review of radiology images and any outside records  Document your discussion with family members, caretakers and with consultants  Document social determinants of health affecting pt's care  Document your decision making why or why not admission, treatments were needed:32947:::1}   Final diagnoses:  Vasovagal syncope  Hypokalemia    ED Discharge Orders          Ordered    doxycycline  (VIBRAMYCIN ) 100 MG capsule  2 times daily        05/08/24 2344    potassium chloride  SA (KLOR-CON  M) 20 MEQ tablet  2 times daily        05/08/24 2344

## 2024-05-08 NOTE — Discharge Instructions (Addendum)
 Your history, lab tests and exam today are reassuring,  this was a fainting spell, not a seizure.  You are being prescribed antibiotics for your abscess, take the entire course of this medicine and continue warm soaks to this infection site twice daily.  Plan followup with your primary MD for a recheck within 1 week.

## 2024-05-08 NOTE — Telephone Encounter (Signed)
 FYI Only or Action Required?: FYI only for provider: ED advised.  Patient was last seen in primary care on 01/11/2022 by Edman Meade PEDLAR, FNP.  Called Nurse Triage reporting Loss of Consciousness.  Symptoms began today.  Interventions attempted: Rest, hydration, or home remedies.  Symptoms are: unchanged.  Triage Disposition: Go to ED Now (Notify PCP)  Patient/caregiver understands and will follow disposition?: Yes Reason for Disposition  [1] Fainted > 15 minutes ago AND [2] still feels weak or dizzy  Answer Assessment - Initial Assessment Questions Pt reports witnessed syncopal episode during which she fell face first in her hallway today around noon today. Unknown length of LOC as her father who was the witnessed is not home. Father did report shaking while she was unconscious. Denies incontinence. Also reports head tenderness, possible loose tooth, split lip.  Pt states she does see doctors at her workplace and has cystic acne. States she was applying medication to a cyst on her thigh. When she did so, it was painful and she believes that it what caused her to faint. States hx of syncope with pain.   Pt states EMS came to evaluate her. States EMS told her that she did not need to be evaluated and had her sign a refusal.   1. ONSET: How long were you unconscious? (e.g., minutes, seconds) When did it happen?     Unknown  2. CONTENT: What happened during the period of unconsciousness? (e.g., seizure activity)      Father reports patient was shaking while unconscious  3. MENTAL STATUS: Alert and oriented now? (e.g., oriented x 3 = name, month, location)      CAOx3  4. TRIGGER: What do you think caused the fainting? What were you doing just before you fainted?  (e.g., exercise, sudden standing up, prolonged standing)     Pain  5. RECURRENT SYMPTOM: Have you ever passed out before? If Yes, ask: When was the last time? and What happened that time?      Yes,  occurred approximately 7 years ago  6. INJURY: Did you hurt yourself when you fell?      See above  7. CARDIAC SYMPTOMS: Have you had any of the following symptoms: chest pain, difficulty breathing, palpitations?     Denies  8. NEUROLOGIC SYMPTOMS: Have you had any of the following symptoms: headache, numbness, vertigo, weakness?     I do feel a bit weak now.    9. PREGNANCY: Is there any chance you are pregnant? When was your last menstrual period?       Denies  Protocols used: Fainting-A-AH Copied from CRM [id number]. Topic: Clinical - Red Word Triage >> May 08, 2024  3:16 PM Carla L wrote: Red Word that prompted transfer to Nurse Triage: patient  fainted, patient believes she fainted due to pain of messing with a cyst  Past Medical History:  Diagnosis Date   Asthma since birth    no current meds   GERD (gastroesophageal reflux disease)    History of kidney stones    Kidney stone    Obesity

## 2024-05-08 NOTE — ED Provider Notes (Signed)
 [place] EMERGENCY DEPARTMENT AT [facility] Provider Note   CSN: [id number] Arrival date & time: 05/08/24  8167     Patient presents with: Loss of Consciousness   Jody Warren is a 38 y.o. female with a history including asthma, GERD, and gets frequent skin infections /abscess of her groin and upper thighs who currently has an abscess in her right upper thigh She has been applying an antibiotic ointment.  Today it opened and drained a significant amount of purulence as she was removing the Band-Aid.  She reports significant pain which caused her to pass out, striking her nose and chin on the floor.  She initially had a loose lower tooth but states it feels tighter than when the injury first occurred. Her father came to her aid and told her she was out for about 5 minutes.  Occurred around 12 noon today.  Patient states she has a history of syncope during painful events.  She has mild soreness across the bridge of her nose and her inside lower lip which bled but has since resolved.  She denies headache, persistent nausea or vomiting, no dizziness, neck pain, confusion.  The abscess on her thigh feels much better now that has drained, she states she has very little pain at the site currently.     The history is provided by the patient.       Prior to Admission medications  Medication Sig Start Date End Date Taking? Authorizing Provider  doxycycline  (VIBRAMYCIN ) 100 MG capsule Take 1 capsule (100 mg total) by mouth 2 (two) times daily. 05/08/24  Yes Ferrel Simington, Mliss, PA-C  potassium chloride  SA (KLOR-CON  M) 20 MEQ tablet Take 1 tablet (20 mEq total) by mouth 2 (two) times daily. 05/08/24  Yes Tushar Enns, PA-C  famotidine  (PEPCID ) 10 MG tablet Take 10 mg by mouth as needed for heartburn or indigestion.    [provider]  metoCLOPramide  (REGLAN ) 10 MG tablet Take 1 tablet (10 mg total) by mouth every 6 (six) hours as needed for nausea or vomiting. 12/31/23   Alyda Megna, PA-C   metroNIDAZOLE  (METROGEL ) 0.75 % gel Apply 1 Application topically 2 (two) times daily. 02/17/22   Bacchus, Gloria Z, FNP  naproxen sodium (ALEVE) 220 MG tablet Take 220 mg by mouth daily as needed. Patient not taking: Reported on 10/07/2021    [provider]  phentermine  37.5 MG capsule Take 1 capsule (37.5 mg total) by mouth every morning. 10/20/21   Bacchus, Gloria Z, FNP  Probiotic Product (PROBIOTIC PO) Take 2 tablets by mouth daily. Patient not taking: Reported on 10/07/2021    [provider]  Vitamin D , Ergocalciferol , (DRISDOL ) 1.25 MG (50000 UNIT) CAPS capsule Take 1 capsule (50,000 Units total) by mouth every 7 (seven) days. 01/11/22   Bacchus, Meade PEDLAR, FNP    Allergies: Peanuts [peanut oil]    Review of Systems  Constitutional:  Negative for fever.  HENT:  Negative for congestion and sore throat.   Eyes: Negative.  Negative for visual disturbance.  Respiratory:  Negative for chest tightness and shortness of breath.   Cardiovascular:  Negative for chest pain.  Gastrointestinal:  Negative for abdominal pain, nausea and vomiting.  Genitourinary: Negative.   Musculoskeletal:  Negative for arthralgias, joint swelling and neck pain.  Skin:  Positive for color change. Negative for rash.  Neurological:  Positive for syncope and headaches. Negative for dizziness, weakness, light-headedness and numbness.  Psychiatric/Behavioral: Negative.      Updated Vital Signs  BP (!) 110/55   Pulse 78   Temp 99 F (37.2 C)   Resp 17   Ht 5' 5 (1.651 m)   Wt 107.4 kg   LMP 04/25/2024 (Exact Date)   SpO2 100%   BMI 39.39 kg/m   Physical Exam Vitals and nursing note reviewed.  Constitutional:      Appearance: She is well-developed.  HENT:     Head: Normocephalic and atraumatic.     Right Ear: Tympanic membrane normal.     Left Ear: Tympanic membrane normal.     Nose:     Comments: Mild tenderness across nasal bridge,  no edema, bruising or deformity.   Eyes:      Extraocular Movements: Extraocular movements intact.     Conjunctiva/sclera: Conjunctivae normal.     Pupils: Pupils are equal, round, and reactive to light.  Cardiovascular:     Rate and Rhythm: Normal rate and regular rhythm.     Heart sounds: Normal heart sounds.  Pulmonary:     Effort: Pulmonary effort is normal.     Breath sounds: Normal breath sounds. No wheezing.  Abdominal:     General: Bowel sounds are normal.     Palpations: Abdomen is soft.     Tenderness: There is no abdominal tenderness.  Musculoskeletal:        General: Normal range of motion.     Cervical back: Normal range of motion and neck supple.  Lymphadenopathy:     Cervical: No cervical adenopathy.  Skin:    General: Skin is warm and dry.     Findings: Erythema present. No rash.     Comments: Open draining abscess right upper medial thigh,  no sig induration.  Faint surrounding erythema apprx 4 cm diameter.   Neurological:     General: No focal deficit present.     Mental Status: She is alert and oriented to person, place, and time.     GCS: GCS eye subscore is 4. GCS verbal subscore is 5. GCS motor subscore is 6.     Cranial Nerves: No cranial nerve deficit.     Sensory: No sensory deficit.     Coordination: Coordination normal.     Gait: Gait normal.     Deep Tendon Reflexes: Reflexes normal.     Comments: Normal heel-shin, normal rapid alternating movements. Cranial nerves III-XII intact.  No pronator drift.  Psychiatric:        Speech: Speech normal.        Behavior: Behavior normal.        Thought Content: Thought content normal.     (all labs ordered are listed, but only abnormal results are displayed) Labs Reviewed  COMPREHENSIVE METABOLIC PANEL WITH GFR - Abnormal; Notable for the following components:      Result Value   Potassium 3.2 (*)    AST 14 (*)    Anion gap 16 (*)    All other components within normal limits  CBC - Abnormal; Notable for the following components:   WBC 16.9 (*)     All other components within normal limits  URINALYSIS, ROUTINE W REFLEX MICROSCOPIC - Abnormal; Notable for the following components:   APPearance HAZY (*)    Hgb urine dipstick SMALL (*)    Bacteria, UA RARE (*)    All other components within normal limits  CBG MONITORING, ED - Abnormal; Notable for the following components:   Glucose-Capillary 107 (*)    All other components within normal limits  POC URINE PREG, ED    EKG: EKG Interpretation Date/Time:  Tuesday May 08 2024 20:41:13 EST Ventricular Rate:  82 PR Interval:  175 QRS Duration:  79 QT Interval:  373 QTC Calculation: 436 R Axis:   49  Text Interpretation: Sinus rhythm Borderline T wave abnormalities Confirmed by Cottie Cough [phone]) on 05/09/2024 12:38:46 PM  Radiology: No results found.   Procedures   Medications Ordered in the ED  doxycycline  (VIBRA -TABS) tablet 100 mg (100 mg Oral Given 05/08/24 2354)  potassium chloride  SA (KLOR-CON  M) CR tablet 40 mEq (40 mEq Oral Given 05/08/24 2354)                                    Medical Decision Making Patient presents with an episode of LOC, occurred as she was removing a Band-Aid from attender abscess site which has actively drained.  Patient endorses frequent problems with syncope during episodes of pain, she had definite prodrome suggesting this was a vasovagal event.  She has a well-healing abscess on her right upper medial thigh, there is faint erythema surrounding this abscess suggesting cellulitis, she is therefore being placed on an antibiotic and will continue warm soaks at home.  Labs and imaging today have been obtained and are reassuring, she does have a significant WBC count at 16.9 however, I suspect this is a direct reflection of her current skin infections, clinically she has no other signs or symptoms of infection.  She does have a mild hypokalemia at 3.2.  This was replaced with a p.o. dose here and a short home prescription.  Patient was  maintained on the cardiac monitor while here, her EKG is reassuring and she had no ectopy during her ED stay, this was unlikely to have been a cardiac source of this syncopal event.  She was advised close follow-up with her primary provider, return here for any new or worsening symptoms.  Amount and/or Complexity of Data Reviewed Labs: ordered.    Details: Labs as indicated above, significant for WBC count of 16.9, potassium of 3.2 Radiology:     Details: We discussed imaging, specifically head and facial imaging given today's event, however she fell more than 8 hours ago, she has no nausea or vomiting, no persistent headache, confusion dizziness or other concerning symptoms to suggest intracranial bleed.  She has no exam findings either to suggest nasal fracture or any other significant facial injury.  We discussed imaging, which shared decision making we both concluded that imaging was not needed at this time. ECG/medicine tests: ordered.    Details: Sinus rhythm rate 82  Risk Prescription drug management.        Final diagnoses:  Vasovagal syncope  Hypokalemia  Abscess of right thigh    ED Discharge Orders          Ordered    doxycycline  (VIBRAMYCIN ) 100 MG capsule  2 times daily        05/08/24 2344    potassium chloride  SA (KLOR-CON  M) 20 MEQ tablet  2 times daily        05/08/24 2344               Emya Picado, PA-C 05/10/24 2326    Suzette Pac, MD 05/11/24 [phone]

## 2024-05-08 NOTE — ED Triage Notes (Addendum)
 Pt reports having a syncopal episode at home earlier to day from pain, has a cyst on right thigh, pt reports EMS was called and they checked her v/s and was told she was fine, pt reports she then called her PCP and was told to come to the ED to be evaluated for seizures and her heart, pt further endorses right ear pain x 2 hours and possible loose tooth

## 2024-05-11 ENCOUNTER — Ambulatory Visit: Admitting: Family Medicine

## 2024-05-11 ENCOUNTER — Ambulatory Visit

## 2024-05-11 VITALS — BP 112/74 | HR 86 | Ht 65.0 in | Wt 237.1 lb

## 2024-05-11 DIAGNOSIS — E876 Hypokalemia: Secondary | ICD-10-CM

## 2024-05-11 DIAGNOSIS — E66813 Obesity, class 3: Secondary | ICD-10-CM

## 2024-05-11 DIAGNOSIS — D72829 Elevated white blood cell count, unspecified: Secondary | ICD-10-CM

## 2024-05-11 DIAGNOSIS — Z6841 Body Mass Index (BMI) 40.0 and over, adult: Secondary | ICD-10-CM

## 2024-05-11 DIAGNOSIS — R55 Syncope and collapse: Secondary | ICD-10-CM

## 2024-05-11 NOTE — Progress Notes (Unsigned)
 Established Patient Office Visit  Subjective   Patient ID: Jody Warren, female    DOB: 1985-12-14  Age: 38 y.o. MRN: [id number]  Chief Complaint  Patient presents with   Hospitalization Follow-up    Pt here for ER/Hospital follow up pt loss consciousness     HPI Discussed the use of AI scribe software for clinical note transcription with the patient, who gave verbal consent to proceed.  History of Present Illness    Jody Warren is a 38 year old female who presents for follow-up after a recent ER visit due to fainting.  Syncope and associated injuries - Experienced a fainting episode earlier this week while sitting on the toilet after removing a Band-Aid from a cyst, which caused pain and triggered syncope. - Attempted to walk down the hallway but fell face-first, resulting in a bruised breast, busted lip, and sore ribs. - No head trauma or head soreness. - Chest and rib soreness persists but is improving. - Nearly fainted again while showering after the initial episode but recovered by sitting down.  History of vasovagal syncope - History of fainting episodes, often triggered by pain or medical procedures. - Frequent fainting as a child during doctor visits. - Prodromal symptoms include vision turning purple and experiencing static or ringing in the ears before fainting.  Electrolyte abnormality: hypokalemia - History of low potassium levels. - Prescribed potassium pills in the ER (20 mg twice daily for seven days), but finds them difficult to swallow due to size. - Supplementing potassium intake with bananas. - Diet primarily consists of salads and fruits, which she believes may contribute to low potassium.  Cutaneous cyst and infection - Currently taking doxycycline  twice daily for ten days for a cyst believed to be infected. - Cyst burst during fainting episode, releasing fluid.  Medication effects: zepbound - Taking Zepbound, recently increased dose to 12.5  mg, prescribed by Burnard Rouleau, FNP. - Experiencing delayed gastric emptying as a side effect, leading to constipation. - Managing constipation with stool softeners and stimulant laxatives. - Occasional difficulty eating due to medication effects.  Lifestyle and physical activity - Works for [employer] in [place]. - Maintains a physically active lifestyle, walking at least two miles daily. - Has eliminated fast food and practices portion control.     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 01/11/2022   Encounter for IUD removal 10/07/2021   GERD (gastroesophageal reflux disease) 01/18/2019   Loose stools 01/16/2019   Biliary dyskinesia 01/16/2019   Chronic cholecystitis 11/09/2018   Abdominal pain, epigastric 06/08/2018   Abnormal transaminases 06/08/2018   Encounter for postpartum visit 01/14/2015   Supervision of normal first pregnancy 04/18/2014   Kidney stone    Obesity, unspecified 07/14/2009   Migraine with aura 07/14/2009   Asthma, currently dormant 07/14/2009   FATIGUE 07/14/2009    ROS    Objective:     BP 112/74   Pulse 86   Ht 5' 5 (1.651 m)   Wt 237 lb 1.3 oz (107.5 kg)   LMP 04/25/2024 (Exact Date)   SpO2 98%   BMI 39.45 kg/m  BP Readings from Last 3 Encounters:  05/11/24 112/74  05/08/24 (!) 110/55  12/31/23 130/66   Wt Readings from Last 3 Encounters:  05/11/24 237 lb 1.3 oz (107.5 kg)  05/08/24 236 lb 11.2 oz (107.4 kg)  12/31/23 294 lb (133.4 kg)     Physical Exam Vitals and nursing note reviewed.  Constitutional:      Appearance: Normal appearance.  HENT:     Head: Normocephalic.  Eyes:     Extraocular Movements: Extraocular movements intact.     Pupils: Pupils are equal, round, and reactive to light.  Cardiovascular:     Rate and Rhythm:  Normal rate and regular rhythm.  Pulmonary:     Effort: Pulmonary effort is normal.     Breath sounds: Normal breath sounds.  Musculoskeletal:     Cervical back: Normal range of motion and neck supple.  Neurological:     Mental Status: She is alert and oriented to person, place, and time.  Psychiatric:        Mood and Affect: Mood normal.        Thought Content: Thought content normal.        Last CBC Lab Results  Component Value Date   WBC 11.4 (H) 05/11/2024   HGB 12.9 05/11/2024   HCT 39.6 05/11/2024   MCV 87 05/11/2024   MCH 28.2 05/11/2024   RDW 13.3 05/11/2024   PLT 396 05/11/2024   Last metabolic panel Lab Results  Component Value Date   GLUCOSE 71 05/11/2024   NA 142 05/11/2024   K 3.4 (L) 05/11/2024   CL 102 05/11/2024   CO2 23 05/11/2024   BUN 11 05/11/2024   CREATININE 0.88 05/11/2024   GFRNONAA >60 05/08/2024   CALCIUM 9.4 05/11/2024   PROT 7.9 05/08/2024   ALBUMIN 4.3 05/08/2024   LABGLOB 2.8 10/19/2021   AGRATIO 1.5 10/19/2021   BILITOT 0.5 05/08/2024   ALKPHOS 83 05/08/2024   AST 14 (L) 05/08/2024   ALT 7 05/08/2024   ANIONGAP 16 (H) 05/08/2024   Last lipids Lab Results  Component Value Date   CHOL 131 10/19/2021   HDL 49 10/19/2021   LDLCALC 69 10/19/2021   TRIG 61 10/19/2021   CHOLHDL 2.7 10/19/2021   Last hemoglobin A1c Lab Results  Component Value Date   HGBA1C 5.7 (H) 10/19/2021   Last thyroid functions Lab Results  Component Value Date   TSH 1.410 10/19/2021   FREET4 1.35 10/19/2021   Last vitamin D  Lab Results  Component Value Date   VD25OH 15.2 (L) 10/19/2021   Last vitamin B12 and Folate No results found for: VITAMINB12, FOLATE    The ASCVD Risk score (Arnett DK, et al., 2019) failed to calculate for the following reasons:   The 2019 ASCVD risk score is only valid for ages 101 to 62    Assessment & Plan:   Problem List Items Addressed This Visit       Other   Obesity, unspecified   Managed with GLP-1  agonist therapy. On 12.5 mg dose. Reports dietary changes, increased activity, nausea, and delayed gastric emptying. - Continue Zepbound at 12.5 mg. - Encouraged dietary modifications and physical activity. - Consider protein smoothie before medication to manage nausea.      Relevant Medications   ZEPBOUND 12.5 MG/0.5ML Pen   Hypokalemia - Primary   Chronic hypokalemia, possibly due to dietary changes. On potassium supplements. Difficulty swallowing large pills. Dietary adjustments made. - Rechecked potassium levels. - Prescribed smaller 10 mg potassium pills if needed.      Relevant Orders   Basic Metabolic Panel (BMET) (Completed)   Leukocytosis   Recheck CBC.        Relevant  Orders   CBC (Completed)   Syncope and collapse   Syncope likely due to pain from skin abscess, causing facial and chest contusions. No head injury. Symptoms include rib pain, chest pain, and lip bruising. No imaging needed. - Rechecked potassium levels. - Continue current management without additional imaging.      No follow-ups on file.    Leita Longs, FNP

## 2024-05-12 LAB — BASIC METABOLIC PANEL WITH GFR
BUN/Creatinine Ratio: 13 (ref 9–23)
BUN: 11 mg/dL (ref 6–20)
CO2: 23 mmol/L (ref 20–29)
Calcium: 9.4 mg/dL (ref 8.7–10.2)
Chloride: 102 mmol/L (ref 96–106)
Creatinine, Ser: 0.88 mg/dL (ref 0.57–1.00)
Glucose: 71 mg/dL (ref 70–99)
Potassium: 3.4 mmol/L — ABNORMAL LOW (ref 3.5–5.2)
Sodium: 142 mmol/L (ref 134–144)
eGFR: 87 mL/min/1.73 (ref >59)

## 2024-05-12 LAB — CBC
Hematocrit: 39.6 % (ref 34.0–46.6)
Hemoglobin: 12.9 g/dL (ref 11.1–15.9)
MCH: 28.2 pg (ref 26.6–33.0)
MCHC: 32.6 g/dL (ref 31.5–35.7)
MCV: 87 fL (ref 79–97)
Platelets: 396 x10E3/uL (ref 150–450)
RBC: 4.57 x10E6/uL (ref 3.77–5.28)
RDW: 13.3 % (ref 11.7–15.4)
WBC: 11.4 x10E3/uL — ABNORMAL HIGH (ref 3.4–10.8)

## 2024-05-13 ENCOUNTER — Ambulatory Visit: Payer: Self-pay

## 2024-05-13 ENCOUNTER — Other Ambulatory Visit: Payer: Self-pay

## 2024-05-13 DIAGNOSIS — E876 Hypokalemia: Secondary | ICD-10-CM

## 2024-05-13 DIAGNOSIS — R55 Syncope and collapse: Secondary | ICD-10-CM | POA: Insufficient documentation

## 2024-05-13 DIAGNOSIS — D72829 Elevated white blood cell count, unspecified: Secondary | ICD-10-CM | POA: Insufficient documentation

## 2024-05-13 MED ORDER — POTASSIUM CHLORIDE ER 10 MEQ PO TBCR: 10.0000 meq | EXTENDED_RELEASE_TABLET | Freq: Two times a day (BID) | ORAL | 3 refills | Status: AC

## 2024-05-13 NOTE — Assessment & Plan Note (Signed)
 Chronic hypokalemia, possibly due to dietary changes. On potassium supplements. Difficulty swallowing large pills. Dietary adjustments made. - Rechecked potassium levels. - Prescribed smaller 10 mg potassium pills if needed.

## 2024-05-13 NOTE — Assessment & Plan Note (Signed)
 Recheck CBC.

## 2024-05-13 NOTE — Assessment & Plan Note (Signed)
 Syncope likely due to pain from skin abscess, causing facial and chest contusions. No head injury. Symptoms include rib pain, chest pain, and lip bruising. No imaging needed. - Rechecked potassium levels. - Continue current management without additional imaging.

## 2024-05-13 NOTE — Assessment & Plan Note (Signed)
 Managed with GLP-1 agonist therapy. On 12.5 mg dose. Reports dietary changes, increased activity, nausea, and delayed gastric emptying. - Continue Zepbound at 12.5 mg. - Encouraged dietary modifications and physical activity. - Consider protein smoothie before medication to manage nausea.

## 2024-05-14 ENCOUNTER — Other Ambulatory Visit: Payer: Self-pay

## 2024-05-14 MED ORDER — CEPHALEXIN 500 MG PO CAPS: 500.0000 mg | ORAL_CAPSULE | Freq: Two times a day (BID) | ORAL | 0 refills | Status: AC

## 2024-08-15 ENCOUNTER — Ambulatory Visit: Payer: Self-pay
